# Patient Record
Sex: Female | Born: 1995 | Race: White | Hispanic: No | Marital: Single | Smoking: Former smoker
Health system: Southern US, Community
[De-identification: ages and names within clinical notes are randomized; demographics above are authoritative.]

## PROBLEM LIST (undated history)

## (undated) ENCOUNTER — Inpatient Hospital Stay: Payer: Self-pay

## (undated) DIAGNOSIS — F319 Bipolar disorder, unspecified: Secondary | ICD-10-CM

## (undated) DIAGNOSIS — F32A Depression, unspecified: Secondary | ICD-10-CM

## (undated) DIAGNOSIS — F329 Major depressive disorder, single episode, unspecified: Secondary | ICD-10-CM

## (undated) DIAGNOSIS — F988 Other specified behavioral and emotional disorders with onset usually occurring in childhood and adolescence: Secondary | ICD-10-CM

## (undated) DIAGNOSIS — F4310 Post-traumatic stress disorder, unspecified: Secondary | ICD-10-CM

## (undated) DIAGNOSIS — F419 Anxiety disorder, unspecified: Secondary | ICD-10-CM

## (undated) HISTORY — DX: Other specified behavioral and emotional disorders with onset usually occurring in childhood and adolescence: F98.8

## (undated) HISTORY — PX: EXTERNAL EAR SURGERY: SHX627

## (undated) HISTORY — PX: WISDOM TOOTH EXTRACTION: SHX21

---
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
## 2015-06-05 ENCOUNTER — Emergency Department
Admission: EM | Admit: 2015-06-05 | Discharge: 2015-06-05 | Disposition: A | Discharge status: Home / Self Care | Payer: BLUE CROSS/BLUE SHIELD | Attending: Emergency Medicine | Admitting: Emergency Medicine

## 2015-06-05 ENCOUNTER — Encounter: Payer: Self-pay | Admitting: Emergency Medicine

## 2015-06-05 ENCOUNTER — Emergency Department: Payer: BLUE CROSS/BLUE SHIELD

## 2015-06-05 DIAGNOSIS — F909 Attention-deficit hyperactivity disorder, unspecified type: Secondary | ICD-10-CM | POA: Insufficient documentation

## 2015-06-05 DIAGNOSIS — Z79899 Other long term (current) drug therapy: Secondary | ICD-10-CM | POA: Insufficient documentation

## 2015-06-05 DIAGNOSIS — F1290 Cannabis use, unspecified, uncomplicated: Secondary | ICD-10-CM | POA: Diagnosis not present

## 2015-06-05 DIAGNOSIS — Y998 Other external cause status: Secondary | ICD-10-CM | POA: Diagnosis not present

## 2015-06-05 DIAGNOSIS — Z87891 Personal history of nicotine dependence: Secondary | ICD-10-CM | POA: Insufficient documentation

## 2015-06-05 DIAGNOSIS — W2201XA Walked into wall, initial encounter: Secondary | ICD-10-CM | POA: Insufficient documentation

## 2015-06-05 DIAGNOSIS — S60221A Contusion of right hand, initial encounter: Secondary | ICD-10-CM | POA: Diagnosis not present

## 2015-06-05 DIAGNOSIS — Y929 Unspecified place or not applicable: Secondary | ICD-10-CM | POA: Insufficient documentation

## 2015-06-05 DIAGNOSIS — Y9389 Activity, other specified: Secondary | ICD-10-CM | POA: Diagnosis not present

## 2015-06-05 DIAGNOSIS — M79641 Pain in right hand: Secondary | ICD-10-CM | POA: Diagnosis present

## 2015-06-05 MED ORDER — MELOXICAM 15 MG PO TABS: 15.0000 mg | ORAL_TABLET | Freq: Every day | ORAL | Status: DC

## 2015-06-05 NOTE — ED Notes (Signed)
States she hit her right hand on a wall  Per pt she blacked and hit wall..bruising and swelling not to back of hand  Positive pulses and good circulation

## 2015-06-05 NOTE — ED Notes (Signed)
Punched a brick wall this morning.  C/o right hand pain.

## 2015-06-05 NOTE — ED Notes (Signed)
Patient discharged to home per MD order. Patient in stable condition, and deemed medically cleared by ED provider for discharge. Discharge instructions reviewed with patient/family using "Teach Back"; verbalized understanding of medication education and administration, and information about follow-up care. Denies further concerns. ° °

## 2015-06-05 NOTE — ED Provider Notes (Signed)
[facility] Emergency Department Provider Note  ____________________________________________  Time seen: Approximately 7:11 PM  I have reviewed the triage vital signs and the nursing notes.   HISTORY  Chief Complaint Hand Pain    HPI Melissa Khan is a 20 y.o. female who presents to emergency department complaining of right hand pain status post hitting a wall. Patient states that when she gets mad she becomes uncontrollable and she hit her hand against a brick wall with a closed fist. Patient is endorsing pain over the fourth knuckle. She denies any pain radiating up her arm. She denies any numbness tingling distal to injury. Patient is not taking any medications prior to arrival.   Past Medical History  Diagnosis Date  . ADD (attention deficit disorder)     There are no active problems to display for this patient.   History reviewed. No pertinent past surgical history.  Current Outpatient Rx  Name  Route  Sig  Dispense  Refill  . drospirenone-ethinyl estradiol (YAZ) 3-0.02 MG tablet   Oral   Take 1 tablet by mouth daily.   3 Package   4   . meloxicam (MOBIC) 15 MG tablet   Oral   Take 1 tablet (15 mg total) by mouth daily.   30 tablet   0   . metroNIDAZOLE (METROGEL VAGINAL) 0.75 % vaginal gel      1 applicator per vagina at HS x 5   70 g   0     Allergies Shrimp and Tramadol  Family History  Problem Relation Age of Onset  . Breast cancer Paternal Aunt     Social History Social History  Substance Use Topics  . Smoking status: Former Smoker    Quit date: 07/24/2014  . Smokeless tobacco: None  . Alcohol Use: No     Comment: ONCE A YEAR     Review of Systems  Constitutional: No fever/chills Cardiovascular: no chest pain. Respiratory: no cough. No SOB. Musculoskeletal: Positive for right hand pain. Positive for bruising. Skin: Negative for rash. Neurological: Negative for headaches, focal weakness or  numbness. 10-point ROS otherwise negative.  ____________________________________________   PHYSICAL EXAM:  VITAL SIGNS: ED Triage Vitals  Enc Vitals Group     BP 06/05/15 1826 99/45 mmHg     Pulse Rate 06/05/15 1826 91     Resp 06/05/15 1826 18     Temp 06/05/15 1826 97.8 F (36.6 C)     Temp Source 06/05/15 1826 Oral     SpO2 06/05/15 1826 100 %     Weight 06/05/15 1826 145 lb (65.772 kg)     Height 06/05/15 1826 5\' 3"  (1.6 m)     Head Cir --      Peak Flow --      Pain Score 06/05/15 1828 8     Pain Loc --      Pain Edu? --      Excl. in GC? --      Constitutional: Alert and oriented. Well appearing and in no acute distress. Eyes: Conjunctivae are normal. PERRL. EOMI. Cardiovascular: Normal rate, regular rhythm. Normal S1 and S2.  Good peripheral circulation. Respiratory: Normal respiratory effort without tachypnea or retractions. Lungs CTAB. Musculoskeletal: Edema and ecchymosis noted to the dorsal aspect of the right hand surrounding the third, fourth, fifth metacarpals. No deformity noted. Tenderness to palpation diffusely in the third, fourth, fifth metacarpals. No palpable abnormality. Full range of motion all digits. Sensation intact 5 digits. Capillary refill  intact 5 digits. Neurologic:  Normal speech and language. No gross focal neurologic deficits are appreciated.  Skin:  Skin is warm, dry and intact. No rash noted. Psychiatric: Mood and affect are normal. Speech and behavior are normal. Patient exhibits appropriate insight and judgement.   ____________________________________________   LABS (all labs ordered are listed, but only abnormal results are displayed)  Labs Reviewed - No data to display ____________________________________________  EKG   ____________________________________________  RADIOLOGY Festus Barren Cuthriell, personally viewed and evaluated these images (plain radiographs) as part of my medical decision making, as well as reviewing  the written report by the radiologist.  Dg Hand Complete Right  06/05/2015  CLINICAL DATA:  Right hand pain and swelling since punching a brick wall this morning. Initial encounter. EXAM: RIGHT HAND - COMPLETE 3+ VIEW COMPARISON:  None. FINDINGS: There is no evidence of fracture or dislocation. There is no evidence of arthropathy or other focal bone abnormality. Soft tissues are unremarkable. IMPRESSION: Negative exam. Electronically Signed   By: Drusilla Kanner M.D.   On: 06/05/2015 18:53    ____________________________________________    PROCEDURES  Procedure(s) performed:       Medications - No data to display   ____________________________________________   INITIAL IMPRESSION / ASSESSMENT AND PLAN / ED COURSE  Pertinent labs & imaging results that were available during my care of the patient were reviewed by me and considered in my medical decision making (see chart for details).  Patient's diagnosis is consistent with right hand contusion. X-ray reveals no acute fractures or other osseous abnormalities.. Patient will be discharged home with prescriptions for anti-inflammatories for symptom control. Her hand is wrapped with an Ace bandage here in the emergency department.. Patient is to follow up with primary care provider or orthopedics if symptoms persist past this treatment course. Patient is given ED precautions to return to the ED for any worsening or new symptoms.     ____________________________________________  FINAL CLINICAL IMPRESSION(S) / ED DIAGNOSES  Final diagnoses:  Hand contusion, right, initial encounter      NEW MEDICATIONS STARTED DURING THIS VISIT:  New Prescriptions   MELOXICAM (MOBIC) 15 MG TABLET    Take 1 tablet (15 mg total) by mouth daily.        This chart was dictated using voice recognition software/Dragon. Despite best efforts to proofread, errors can occur which can change the meaning. Any change was purely  unintentional.    Racheal Patches, PA-C 06/05/15 1918  Jene Every, MD 06/05/15 2243

## 2015-06-05 NOTE — Discharge Instructions (Signed)
Hand Contusion  A hand contusion is a deep bruise on your hand area. Contusions are the result of an injury that caused bleeding under the skin. The contusion may turn blue, purple, or yellow. Minor injuries will give you a painless contusion, but more severe contusions may stay painful and swollen for a few weeks.  CAUSES   A contusion is usually caused by a blow, trauma, or direct force to an area of the body.  SYMPTOMS    Swelling and redness of the injured area.   Discoloration of the injured area.   Tenderness and soreness of the injured area.   Pain.  DIAGNOSIS   The diagnosis can be made by taking a history and performing a physical exam. An X-ray, CT scan, or MRI may be needed to determine if there were any associated injuries, such as broken bones (fractures).  TREATMENT   Often, the best treatment for a hand contusion is resting, elevating, icing, and applying cold compresses to the injured area. Over-the-counter medicines may also be recommended for pain control.  HOME CARE INSTRUCTIONS    Put ice on the injured area.    Put ice in a plastic bag.    Place a towel between your skin and the bag.    Leave the ice on for 15-20 minutes, 03-04 times a day.   Only take over-the-counter or prescription medicines as directed by your caregiver. Your caregiver may recommend avoiding anti-inflammatory medicines (aspirin, ibuprofen, and naproxen) for 48 hours because these medicines may increase bruising.   If told, use an elastic wrap as directed. This can help reduce swelling. You may remove the wrap for sleeping, showering, and bathing. If your fingers become numb, cold, or blue, take the wrap off and reapply it more loosely.   Elevate your hand with pillows to reduce swelling.   Avoid overusing your hand if it is painful.  SEEK IMMEDIATE MEDICAL CARE IF:    You have increased redness, swelling, or pain in your hand.   Your swelling or pain is not relieved with medicines.   You have loss of feeling in  your hand or are unable to move your fingers.   Your hand turns cold or blue.   You have pain when you move your fingers.   Your hand becomes warm to the touch.   Your contusion does not improve in 2 days.  MAKE SURE YOU:    Understand these instructions.   Will watch your condition.   Will get help right away if you are not doing well or get worse.     This information is not intended to replace advice given to you by your health care provider. Make sure you discuss any questions you have with your health care provider.     Document Released: 08/14/2001 Document Revised: 11/17/2011 Document Reviewed: 08/16/2011  Elsevier Interactive Patient Education 2016 [employer]

## 2015-09-07 ENCOUNTER — Emergency Department
Admission: EM | Admit: 2015-09-07 | Discharge: 2015-09-08 | Disposition: A | Discharge status: Other Acute Inpatient Hospital | Payer: BLUE CROSS/BLUE SHIELD | Attending: Emergency Medicine | Admitting: Emergency Medicine

## 2015-09-07 DIAGNOSIS — R440 Auditory hallucinations: Secondary | ICD-10-CM | POA: Diagnosis present

## 2015-09-07 DIAGNOSIS — Z87891 Personal history of nicotine dependence: Secondary | ICD-10-CM | POA: Insufficient documentation

## 2015-09-07 DIAGNOSIS — F329 Major depressive disorder, single episode, unspecified: Secondary | ICD-10-CM | POA: Diagnosis not present

## 2015-09-07 DIAGNOSIS — F29 Unspecified psychosis not due to a substance or known physiological condition: Secondary | ICD-10-CM

## 2015-09-07 DIAGNOSIS — Z79899 Other long term (current) drug therapy: Secondary | ICD-10-CM | POA: Insufficient documentation

## 2015-09-07 DIAGNOSIS — F988 Other specified behavioral and emotional disorders with onset usually occurring in childhood and adolescence: Secondary | ICD-10-CM | POA: Insufficient documentation

## 2015-09-07 LAB — CBC
HCT: 38.7 % (ref 35.0–47.0)
Hemoglobin: 13 g/dL (ref 12.0–16.0)
MCH: 27.8 pg (ref 26.0–34.0)
MCHC: 33.6 g/dL (ref 32.0–36.0)
MCV: 82.8 fL (ref 80.0–100.0)
PLATELETS: 276 10*3/uL (ref 150–440)
RBC: 4.68 MIL/uL (ref 3.80–5.20)
RDW: 16.2 % — AB (ref 11.5–14.5)
WBC: 7.2 10*3/uL (ref 3.6–11.0)

## 2015-09-07 LAB — COMPREHENSIVE METABOLIC PANEL
ALT: 21 U/L (ref 14–54)
AST: 24 U/L (ref 15–41)
Albumin: 4.4 g/dL (ref 3.5–5.0)
Alkaline Phosphatase: 52 U/L (ref 38–126)
Anion gap: 7 (ref 5–15)
BILIRUBIN TOTAL: 1.1 mg/dL (ref 0.3–1.2)
BUN: 13 mg/dL (ref 6–20)
CHLORIDE: 104 mmol/L (ref 101–111)
CO2: 26 mmol/L (ref 22–32)
CREATININE: 0.8 mg/dL (ref 0.44–1.00)
Calcium: 9.2 mg/dL (ref 8.9–10.3)
Glucose, Bld: 134 mg/dL — ABNORMAL HIGH (ref 65–99)
POTASSIUM: 3 mmol/L — AB (ref 3.5–5.1)
Sodium: 137 mmol/L (ref 135–145)
TOTAL PROTEIN: 7.5 g/dL (ref 6.5–8.1)

## 2015-09-07 LAB — URINE DRUG SCREEN, QUALITATIVE (ARMC ONLY)
Amphetamines, Ur Screen: NOT DETECTED
BARBITURATES, UR SCREEN: NOT DETECTED
Benzodiazepine, Ur Scrn: POSITIVE — AB
CANNABINOID 50 NG, UR ~~LOC~~: POSITIVE — AB
Cocaine Metabolite,Ur ~~LOC~~: NOT DETECTED
MDMA (Ecstasy)Ur Screen: NOT DETECTED
Methadone Scn, Ur: NOT DETECTED
Opiate, Ur Screen: NOT DETECTED
PHENCYCLIDINE (PCP) UR S: NOT DETECTED
TRICYCLIC, UR SCREEN: NOT DETECTED

## 2015-09-07 LAB — ETHANOL

## 2015-09-07 LAB — ACETAMINOPHEN LEVEL: Acetaminophen (Tylenol), Serum: <10 ug/mL — ABNORMAL LOW (ref 10–30)

## 2015-09-07 LAB — SALICYLATE LEVEL

## 2015-09-07 NOTE — ED Notes (Signed)

## 2015-09-07 NOTE — ED Notes (Signed)
BEHAVIORAL HEALTH ROUNDING  Patient sleeping: No.  Patient alert and oriented: yes  Behavior appropriate: Yes. ; If no, describe:  Nutrition and fluids offered: Yes  Toileting and hygiene offered: Yes  Sitter present: not applicable, Q 15 min safety rounds and observation.  Law enforcement present: Yes ODS  
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
## 2015-09-07 NOTE — ED Notes (Addendum)
POC Urine Pregnancy Done; Negative Result.
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
## 2015-09-07 NOTE — ED Notes (Signed)
[facility] computer removed from room. Sitter at bedside. Will call reports to ED BHU RN.

## 2015-09-08 ENCOUNTER — Inpatient Hospital Stay
Admission: AD | Admit: 2015-09-08 | Discharge: 2015-09-09 | DRG: 885 | Disposition: A | Discharge status: Home / Self Care | Payer: BLUE CROSS/BLUE SHIELD | Source: Intra-hospital | Attending: Psychiatry | Admitting: Psychiatry

## 2015-09-08 ENCOUNTER — Encounter: Payer: Self-pay | Admitting: Psychiatry

## 2015-09-08 DIAGNOSIS — F1310 Sedative, hypnotic or anxiolytic abuse, uncomplicated: Secondary | ICD-10-CM | POA: Diagnosis present

## 2015-09-08 DIAGNOSIS — N390 Urinary tract infection, site not specified: Secondary | ICD-10-CM | POA: Diagnosis present

## 2015-09-08 DIAGNOSIS — Z803 Family history of malignant neoplasm of breast: Secondary | ICD-10-CM | POA: Diagnosis not present

## 2015-09-08 DIAGNOSIS — Z87891 Personal history of nicotine dependence: Secondary | ICD-10-CM | POA: Diagnosis not present

## 2015-09-08 DIAGNOSIS — Z818 Family history of other mental and behavioral disorders: Secondary | ICD-10-CM

## 2015-09-08 DIAGNOSIS — S61519A Laceration without foreign body of unspecified wrist, initial encounter: Secondary | ICD-10-CM | POA: Diagnosis present

## 2015-09-08 DIAGNOSIS — F333 Major depressive disorder, recurrent, severe with psychotic symptoms: Secondary | ICD-10-CM

## 2015-09-08 DIAGNOSIS — X789XXA Intentional self-harm by unspecified sharp object, initial encounter: Secondary | ICD-10-CM | POA: Diagnosis present

## 2015-09-08 DIAGNOSIS — G4700 Insomnia, unspecified: Secondary | ICD-10-CM | POA: Diagnosis present

## 2015-09-08 DIAGNOSIS — F323 Major depressive disorder, single episode, severe with psychotic features: Secondary | ICD-10-CM | POA: Diagnosis present

## 2015-09-08 DIAGNOSIS — R45851 Suicidal ideations: Secondary | ICD-10-CM | POA: Diagnosis present

## 2015-09-08 DIAGNOSIS — F1220 Cannabis dependence, uncomplicated: Secondary | ICD-10-CM | POA: Diagnosis present

## 2015-09-08 DIAGNOSIS — Z888 Allergy status to other drugs, medicaments and biological substances status: Secondary | ICD-10-CM

## 2015-09-08 DIAGNOSIS — F332 Major depressive disorder, recurrent severe without psychotic features: Principal | ICD-10-CM | POA: Diagnosis present

## 2015-09-08 LAB — URINALYSIS COMPLETE WITH MICROSCOPIC (ARMC ONLY)
BACTERIA UA: NONE SEEN
Bilirubin Urine: NEGATIVE
GLUCOSE, UA: NEGATIVE mg/dL
Leukocytes, UA: NEGATIVE
NITRITE: NEGATIVE
Protein, ur: 100 mg/dL — AB
SPECIFIC GRAVITY, URINE: 1.03 (ref 1.005–1.030)
pH: 6 (ref 5.0–8.0)

## 2015-09-08 MED ORDER — TRAZODONE HCL 100 MG PO TABS: 100.0000 mg | ORAL_TABLET | Freq: Every day | ORAL | Status: DC

## 2015-09-08 MED ORDER — TRAZODONE HCL 100 MG PO TABS: 100.0000 mg | ORAL_TABLET | Freq: Every evening | ORAL | Status: DC | PRN

## 2015-09-08 MED ORDER — MAGNESIUM HYDROXIDE 400 MG/5ML PO SUSP: 30.0000 mL | Freq: Every day | ORAL | Status: DC | PRN

## 2015-09-08 MED ORDER — LORAZEPAM 2 MG PO TABS
2.0000 mg | ORAL_TABLET | ORAL | Status: DC | PRN
  Administered 2015-09-08: 2 mg via ORAL
  Filled 2015-09-08: qty 1

## 2015-09-08 MED ORDER — PRAZOSIN HCL 2 MG PO CAPS
2.0000 mg | ORAL_CAPSULE | Freq: Two times a day (BID) | ORAL | Status: DC
  Administered 2015-09-08: 2 mg via ORAL
  Filled 2015-09-08: qty 1

## 2015-09-08 MED ORDER — ALUM & MAG HYDROXIDE-SIMETH 200-200-20 MG/5ML PO SUSP: 30.0000 mL | ORAL | Status: DC | PRN

## 2015-09-08 MED ORDER — IBUPROFEN 600 MG PO TABS: 600.0000 mg | ORAL_TABLET | Freq: Four times a day (QID) | ORAL | Status: DC | PRN

## 2015-09-08 MED ORDER — RISPERIDONE 1 MG PO TABS: 2.0000 mg | ORAL_TABLET | Freq: Every day | ORAL | Status: DC

## 2015-09-08 MED ORDER — LAMOTRIGINE 25 MG PO TABS
25.0000 mg | ORAL_TABLET | Freq: Every day | ORAL | Status: DC
  Administered 2015-09-08: 25 mg via ORAL
  Filled 2015-09-08: qty 1

## 2015-09-08 MED ORDER — ARIPIPRAZOLE 2 MG PO TABS
2.0000 mg | ORAL_TABLET | Freq: Every day | ORAL | Status: DC
  Filled 2015-09-08: qty 1

## 2015-09-08 MED ORDER — OXCARBAZEPINE 150 MG PO TABS: 150.0000 mg | ORAL_TABLET | Freq: Two times a day (BID) | ORAL | Status: DC

## 2015-09-08 NOTE — ED Notes (Signed)
Report was received from Dorise HissElizabeth C., RN; PtTrenton Khan. Verbalizes; "I don't need to be here; I will never get fixed; the pain is too deep."; verbalizes having S.I; with self inflicted superficial cuts to left arm(upper and lower); denies having h.i.; Continue to monitor with 15 min. Monitoring.

## 2015-09-08 NOTE — ED Notes (Signed)
Patient given meal tray which she says she will not eat.

## 2015-09-08 NOTE — ED Notes (Signed)
[facility] inpatient treatment. Cooperative with transfer. All personal belongings sent with patient.

## 2015-09-08 NOTE — ED Notes (Signed)

## 2015-09-08 NOTE — ED Notes (Signed)
Floor not able to take report/patient until after 12 noon.

## 2015-09-08 NOTE — ED Notes (Signed)
Patient asleep in room. No noted distress or abnormal behavior. Will continue 15 minute checks and observation by security cameras for safety. 

## 2015-09-08 NOTE — ED Notes (Signed)
Report given to floor. They will be able to accept patient at 2 pm.

## 2015-09-08 NOTE — ED Notes (Signed)
Patient upset that she will be admitted into inpatient unit. States she cannot be at the hospital where her some of her family members died. She is minimizing some of the behaviors which resulted in commitment for treatment. Maintained on 15 minute checks and observation by security camera for safety.

## 2015-09-08 NOTE — ED Notes (Signed)
Patient's mother in to visit 

## 2015-09-08 NOTE — [facility] Group Notes (Signed)
[facility] Group Notes:  (Nursing/MHT/Case Management/Adjunct)  Date:  09/08/2015  Time:  4:18 PM  Type of Therapy:  Psychoeducational Skills  Participation Level:  Did Not Attend    Hani Patnode M Moses Ellison 09/08/2015, 4:18 PM 

## 2015-09-08 NOTE — Tx Team (Signed)
Initial Interdisciplinary Treatment Plan   PATIENT STRESSORS: Medication change or noncompliance Occupational concerns Substance abuse   PATIENT STRENGTHS: Average or above average intelligence Communication skills Supportive family/friends   PROBLEM LIST: Problem List/Patient Goals Date to be addressed Date deferred Reason deferred Estimated date of resolution  Major depression 09/08/2015     Substance abuse 09/08/2015                                                DISCHARGE CRITERIA:  Ability to meet basic life and health needs Adequate post-discharge living arrangements Verbal commitment to aftercare and medication compliance  PRELIMINARY DISCHARGE PLAN: Attend aftercare/continuing care group Return to previous living arrangement  PATIENT/FAMIILY INVOLVEMENT: This treatment plan has been presented to and reviewed with the patient, Ronni RumbleArizona E Coole, and/or family member,   The patient and family have been given the opportunity to ask questions and make suggestions.  Margo CommonGigi George Adrina Armijo 09/08/2015, 3:52 PM

## 2015-09-08 NOTE — Progress Notes (Deleted)
Per Dr. Lucianne MussKumar, [facility] Admission and bed assignment pending. Admitting Dr. Lucianne MussKumar. Demetre Monaco K. Sherlon HandingHarris, LCAS-A, LPC-A, [facility]  Counselor 09/08/2015 9:12 AM

## 2015-09-08 NOTE — Progress Notes (Signed)
Patient sad but cooperative during admission assessment. Patient denies SI/HI at this time. Patient denies AVH. Patient informed of fall risk status, fall risk assessed "low" at this time. Patient oriented to unit/staff/room. Patient denies any questions/concerns at this time. Patient safe on unit with Q15 minute checks for safety. Skin assessment & body search done.No contraband found.

## 2015-09-08 NOTE — Progress Notes (Signed)
[facility] by Dr. Dr. Lucianne MussKumar.  Attending Physician will be Dr. Ardyth HarpsHernandez and Pucilowska.   Patient has been assigned to room 322, [facility] Marylu LundJanet.   Intake Paper Work has been signed and placed on patient chart.  ER staff is aware of the admission ( Lisa,ER Sect.; Dr. Mayford KnifeWilliams , ER MD; Amy,Patient's Nurse & Robley FriesUrcella Patient Access).  Kem Hensen K. Alexsandra Shontz, LCAS-A, LPC-A, [facility]  Counselor 09/08/2015 10:50 AM

## 2015-09-08 NOTE — ED Provider Notes (Signed)
-----------------------------------------   3:43 AM on 09/08/2015 -----------------------------------------   Blood pressure 119/66, pulse 108, temperature 98.6 F (37 C), temperature source Oral, resp. rate 18, height 5\' 3"  (1.6 m), weight 140 lb (63.504 kg), last menstrual period 09/07/2015, SpO2 99 %.  The patient had no acute events since last update.  Calm and cooperative at this time.  Disposition is pending per Psychiatry/Behavioral Medicine team recommendations.     Arnaldo NatalPaul F Zaidan Keeble, MD 09/08/15 [phone]

## 2015-09-08 NOTE — ED Notes (Signed)
Patient resting quietly in room. No noted distress or abnormal behaviors noted. Will continue 15 minute checks and observation by security camera for safety. 

## 2015-09-08 NOTE — ED Notes (Signed)
Patient spoke with her mother on the phone.  Patient is insisting that she does not need to be in the hospital and if kept, she will become irritable and angry. Behavioral expectations explained to her.  Patient is malodorous and disheveled but is refusing to take a shower. She states she will not use hospital hygiene products. She is also refusing to eat hospital food because it "reminds her of the time her grandmother was in the hospital."  Patient noted to have multiple superficial lacerations on left inner forearm and upper left shoulder. No s/s infection.Maintained on 15 minute checks and observation by security camera for safety.
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
## 2015-09-08 NOTE — ED Notes (Signed)
ED BHU PLACEMENT JUSTIFICATION Is the patient under IVC or is there intent for IVC: Yes.   Is the patient medically cleared: Yes.   Is there vacancy in the ED BHU: Yes.   Is the population mix appropriate for patient: Yes.   Is the patient awaiting placement in inpatient or outpatient setting: Yes.   Has the patient had a psychiatric consult: Yes.   Survey of unit performed for contraband, proper placement and condition of furniture, tampering with fixtures in bathroom, shower, and each patient room: Yes.   APPEARANCE/BEHAVIOR cooperative and adequate rapport can be established NEURO ASSESSMENT Orientation: place and person Hallucinations: No.None noted (Hallucinations) Speech: Normal Gait: normal RESPIRATORY ASSESSMENT Normal expansion.  Clear to auscultation.  No rales, rhonchi, or wheezing. CARDIOVASCULAR ASSESSMENT regular rate and rhythm, S1, S2 normal, no murmur, click, rub or gallop GASTROINTESTINAL ASSESSMENT soft, nontender, BS WNL, no r/g EXTREMITIES normal strength, tone, and muscle mass PLAN OF CARE Provide calm/safe environment. Vital signs assessed twice daily. ED BHU Assessment once each 12-hour shift. Collaborate with intake RN daily or as condition indicates. Assure the ED provider has rounded once each shift. Provide and encourage hygiene. Provide redirection as needed. Assess for escalating behavior; address immediately and inform ED provider.  Assess family dynamic and appropriateness for visitation as needed: Yes.   Educate the patient/family about BHU procedures/visitation: Yes.   

## 2015-09-08 NOTE — ED Notes (Signed)
Report to Margaret, RN in ED BHU.  

## 2015-09-08 NOTE — [facility] Suicide Risk Assessment (Signed)
[facility]   Nursing information obtained from:    Demographic factors:    Current Mental Status:    Loss Factors:    Historical Factors:    Risk Reduction Factors:     Total Time spent with patient: 1 hour Principal Problem: Severe recurrent major depressive disorder with psychotic features [place]([facility]) Diagnosis:   Patient Active Problem List   Diagnosis Date Noted  . Severe recurrent major depressive disorder with psychotic features ([facility]) [F33.3] 09/08/2015  . Sedative, hypnotic or anxiolytic use disorder, mild, abuse [F13.10] 09/08/2015  . Cannabis use disorder, moderate, dependence ([facility]) [F12.20] 09/08/2015  . Suicidal ideation [R45.851] 09/08/2015  . Self-inflicted laceration of wrist [S61.519A] 09/08/2015   Subjective Data: Depression, mood instability, self-injurious behaviors, substance use.  Continued Clinical Symptoms:  Alcohol Use Disorder Identification Test Final Score (AUDIT): 1 The "Alcohol Use Disorders Identification Test", Guidelines for Use in Primary Care, Second Edition.  World Science writerHealth Organization [facility](WHO). Score between 0-7:  no or low risk or alcohol related problems. Score between 8-15:  moderate risk of alcohol related problems. Score between 16-19:  high risk of alcohol related problems. Score 20 or above:  warrants further diagnostic evaluation for alcohol dependence and treatment.   CLINICAL FACTORS:   Severe Anxiety and/or Agitation Depression:   Impulsivity Alcohol/Substance Abuse/Dependencies   Musculoskeletal: Strength & Muscle Tone: within normal limits Gait & Station: normal Patient leans: N/A  Psychiatric Specialty Exam: Physical Exam  Nursing note and vitals reviewed.   Review of Systems  Psychiatric/Behavioral: Positive for depression and substance abuse. The patient is nervous/anxious.   All other systems reviewed and are negative.   Blood pressure 118/72, pulse 88, temperature 98.2 F (36.8 C), temperature  source Oral, resp. rate 18, height 5\' 3"  (1.6 m), weight 60.782 kg (134 lb), last menstrual period 09/07/2015, SpO2 100 %.Body mass index is 23.74 kg/(m^2).  General Appearance: Fairly Groomed  Eye Contact:  Good  Speech:  Clear and Coherent  Volume:  Normal  Mood:  Anxious  Affect:  Appropriate  Thought Process:  Goal Directed  Orientation:  Full (Time, Place, and Person)  Thought Content:  Hallucinations: Auditory Command:  Telling her to hurt herself  Suicidal Thoughts:  Yes.  with intent/plan  Homicidal Thoughts:  No  Memory:  Immediate;   Fair Recent;   Fair Remote;   Fair  Judgement:  Impaired  Insight:  Lacking  Psychomotor Activity:  Normal  Concentration:  Concentration: Fair and Attention Span: Fair  Recall:  [employer]is[employer]Fair  Fund of Knowledge:  Fair  Language:  Fair  Akathisia:  No  Handed:  Right  AIMS (if indicated):     Assets:  Communication Skills Desire for Improvement Financial Resources/Insurance Housing Physical Health Resilience Social Support  ADL's:  Intact  Cognition:  WNL  Sleep:         COGNITIVE FEATURES THAT CONTRIBUTE TO RISK:  None    SUICIDE RISK:   Moderate:  Frequent suicidal ideation with limited intensity, and duration, some specificity in terms of plans, no associated intent, good self-control, limited dysphoria/symptomatology, some risk factors present, and identifiable protective factors, including available and accessible social support.  PLAN OF CARE: Hospital admission, medication management, substance abuse counseling, discharge planning.  [name] is a 20 year old female with history of depression, anxiety, psychosis, mood instability and self-injurious behavior admitted for worsening of her symptoms and cutting episode.  1. Suicidal ideation. The patient is able to contract for safety.  2. Mood and psychosis.  We will start Trileptal for mood stabilization and Risperdal for psychotic symptoms.  3. Substance abuse. The patient was  positive for cannabis and benzodiazepines. She minimizes her problems and declines treatment.  4. Smoking. Nicotine patch is available.  5. Insomnia. Trazodone is available.  6. Disposition. She will be discharged to home with her mother. She will follow up with [facility].   I certify that inpatient services furnished can reasonably be expected to improve the patient's condition.   Kristine LineaJolanta Hamid Brookens, MD 09/08/2015, 3:34 PM

## 2015-09-09 DIAGNOSIS — F333 Major depressive disorder, recurrent, severe with psychotic symptoms: Secondary | ICD-10-CM | POA: Insufficient documentation

## 2015-09-09 LAB — LIPID PANEL
CHOL/HDL RATIO: 5.6 ratio
CHOLESTEROL: 189 mg/dL (ref 0–200)
HDL: 34 mg/dL — AB (ref >40)
LDL Cholesterol: 134 mg/dL — ABNORMAL HIGH (ref 0–99)
TRIGLYCERIDES: 103 mg/dL (ref <150)
VLDL: 21 mg/dL (ref 0–40)

## 2015-09-09 LAB — TSH: TSH: 2.313 u[IU]/mL (ref 0.350–4.500)

## 2015-09-09 LAB — HEMOGLOBIN A1C: Hgb A1c MFr Bld: 5.4 % (ref 4.0–6.0)

## 2015-09-09 MED ORDER — FOSFOMYCIN TROMETHAMINE 3 G PO PACK
3.0000 g | PACK | Freq: Once | ORAL | Status: DC
  Filled 2015-09-09: qty 3

## 2015-09-09 MED ORDER — LAMOTRIGINE 25 MG PO TABS: 25.0000 mg | ORAL_TABLET | Freq: Every day | ORAL | Status: DC

## 2015-09-09 MED ORDER — PRAZOSIN HCL 2 MG PO CAPS: 2.0000 mg | ORAL_CAPSULE | Freq: Two times a day (BID) | ORAL | Status: DC

## 2015-09-09 MED ORDER — TRAZODONE HCL 100 MG PO TABS: 100.0000 mg | ORAL_TABLET | Freq: Every evening | ORAL | Status: DC | PRN

## 2015-09-09 NOTE — [facility] Group Notes (Signed)
[facility] LCSW Group Therapy   09/09/2015 9:30 am  Type of Therapy: Group Therapy   Participation Level: Invited but did not attend.  Participation Quality: Invited but did not attend.    Melissa Khan R. Sira Adsit, LCSWA   
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
## 2015-09-09 NOTE — [facility] Group Notes (Signed)
[facility] Group Notes:  (Nursing/MHT/Case Management/Adjunct)  Date:  09/09/2015  Time:  3:55 AM  Type of Therapy:  Psychoeducational Skills  Participation Level:  Minimal  Participation Quality:  Appropriate  Affect:  Appropriate  Cognitive:  Appropriate  Insight:  Appropriate and Lacking  Engagement in Group:  Limited  Modes of Intervention:  Discussion, Socialization and Support  Summary of Progress/Problems:  Chancy MilroyLaquanda Y Khamryn Calderone 09/09/2015, 3:55 AM

## 2015-09-09 NOTE — [facility] Suicide Risk Assessment (Signed)
[facility] INPATIENT:  Family/Significant Other Suicide Prevention Education  Suicide Prevention Education:  Education Completed; Fish farm managerDenice Zaucha, Mother,  (name of family member/significant other) has been identified by the patient as the family member/significant other with whom the patient will be residing, and identified as the person(s) who will aid the patient in the event of a mental health crisis (suicidal ideations/suicide attempt).  With written consent from the patient, the family member/significant other has been provided the following suicide prevention education, prior to the and/or following the discharge of the patient.  The suicide prevention education provided includes the following:  Suicide risk factors  Suicide prevention and interventions  National Suicide Hotline telephone number  [facility] assessment telephone number  [facility] Emergency Assistance 911  [place] and/or Residential Mobile Crisis Unit telephone number  Request made of family/significant other to:  Remove weapons (e.g., guns, rifles, knives), all items previously/currently identified as safety concern.    Remove drugs/medications (over-the-counter, prescriptions, illicit drugs), all items previously/currently identified as a safety concern.  The family member/significant other verbalizes understanding of the suicide prevention education information provided.  The family member/significant other agrees to remove the items of safety concern listed above.  Glennon MacLaws, Alfredo Spong P, MSW, LCSW 09/09/2015, 2:43 PM

## 2015-09-09 NOTE — Plan of Care (Signed)
Problem: Coping: Goal: Ability to verbalize frustrations and anger appropriately will improve Outcome: Progressing Verbalizing frustration.

## 2015-09-09 NOTE — Progress Notes (Signed)
Patient discharged home. DC instructions provided and explained, medications reviewed. Rx given. All questions answered. Belongings returned from safe and locker. Stored medications returned. Denies SI, HI, AVH.
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
## 2015-09-09 NOTE — Progress Notes (Addendum)
D: Pt denies SI/HI/AVH. Pt is irritable about her admission on the unit, but is cooperative with treatment plan, Patient  affect is blunted. Pt appears anxious and restless, she stated she is "worried that she is not with her service animal that calms her down and request for discharge now" MD on call was notified of patient restless, anxiety ad sometimes tearful episodes. Patient was counseled, supported and reassured     A: Pt was offered support and encouragement. Pt was also given scheduled medications. Pt was encouraged to attend groups. Q 15 minute checks were done for safety.  R:Pt attends groups and interacts well with peers and staff. Pt is taking medication. Pt has no complaints.Pt receptive to treatment and safety maintained on unit.

## 2015-09-09 NOTE — Progress Notes (Signed)
  [facility] :  Will you be returning to the same living situation after discharge:  Yes,    At discharge, do you have transportation home?: Yes,  '  Do you have the ability to pay for your medications: Yes,     Release of information consent forms completed and in the chart;  Patient's signature needed at discharge.  Patient to Follow up at: Follow-up Information    Go to [facility].   Why:  Referral made for Hospital Follow up, Outpatient Medication Management, Therapy.  Please call to schedule appointment [facility] after referral has been reviewed.   Contact information:   [address] [place] [id number] [phone] Fax      Next level of care provider has access to [facility] Link:yes  Safety Planning and Suicide Prevention discussed: Yes,     Have you used any form of tobacco in the last 30 days? (Cigarettes, Smokeless Tobacco, Cigars, and/or Pipes): No  Has patient been referred to the Quitline?: N/A patient is not a smoker  Patient has been referred for addiction treatment: Yes  Joslyne Marshburn, Cleda DaubSara P, MSW, LCSW 09/09/2015, 2:43 PM

## 2015-09-09 NOTE — [facility] Suicide Risk Assessment (Signed)
[facility]   Principal Problem: Severe episode of recurrent major depressive disorder, without psychotic features [facility](HCC) Discharge Diagnoses:  Patient Active Problem List   Diagnosis Date Noted  . Severe episode of recurrent major depressive disorder, without psychotic features (HCC) [F33.2] 09/08/2015  . Sedative, hypnotic or anxiolytic use disorder, mild, abuse [F13.10] 09/08/2015  . Cannabis use disorder, moderate, dependence (HCC) [F12.20] 09/08/2015  . Suicidal ideation [R45.851] 09/08/2015  . Self-inflicted laceration of wrist [S61.519A] 09/08/2015    Total Time spent with patient: 30 minutes  Musculoskeletal: Strength & Muscle Tone: within normal limits Gait & Station: normal Patient leans: N/A  Psychiatric Specialty Exam: Review of Systems  Psychiatric/Behavioral: Positive for substance abuse. The patient is nervous/anxious.   All other systems reviewed and are negative.   Blood pressure 120/76, pulse 88, temperature 98.2 F (36.8 C), temperature source Oral, resp. rate 18, height 5\' 3"  (1.6 m), weight 60.782 kg (134 lb), last menstrual period 09/07/2015, SpO2 100 %.Body mass index is 23.74 kg/(m^2).  General Appearance: Casual  Eye Contact::  Good  Speech:  Clear and Coherent409  Volume:  Normal  Mood:  Euthymic  Affect:  Appropriate  Thought Process:  Goal Directed  Orientation:  Full (Time, Place, and Person)  Thought Content:  WDL  Suicidal Thoughts:  No  Homicidal Thoughts:  No  Memory:  Immediate;   Fair Recent;   Fair Remote;   Fair  Judgement:  Impaired  Insight:  Shallow  Psychomotor Activity:  Normal  Concentration:  Fair  Recall:  FiservFair  Fund of Knowledge:Fair  Language: Fair  Akathisia:  No  Handed:  Right  AIMS (if indicated):     Assets:  Communication Skills Desire for Improvement Financial Resources/Insurance Housing Physical Health Resilience Social Support Transportation Vocational/Educational  Sleep:      Cognition: WNL  ADL's:  Intact   Mental Status Per Nursing Assessment::   On Admission:  Self-harm behaviors  Demographic Factors:  Adolescent or young adult, Caucasian, Low socioeconomic status and Unemployed  Loss Factors: Loss of significant relationship and Financial problems/change in socioeconomic status  Historical Factors: Family history of mental illness or substance abuse and Impulsivity  Risk Reduction Factors:   Sense of responsibility to family, Living with another person, especially a relative and Positive social support  Continued Clinical Symptoms:  Depression:   Comorbid alcohol abuse/dependence Impulsivity Alcohol/Substance Abuse/Dependencies  Cognitive Features That Contribute To Risk:  None    Suicide Risk:  Minimal: No identifiable suicidal ideation.  Patients presenting with no risk factors but with morbid ruminations; may be classified as minimal risk based on the severity of the depressive symptoms    Plan Of Care/Follow-up recommendations:  Activity:  as tolerated. Diet:  regular. Other:  keep follow up appointments.  Kristine LineaJolanta Marvion Bastidas, MD 09/09/2015, 6:03 AM

## 2015-09-10 LAB — PROLACTIN: Prolactin: 77 ng/mL — ABNORMAL HIGH (ref 4.8–23.3)

## 2015-11-07 ENCOUNTER — Other Ambulatory Visit: Payer: Self-pay | Admitting: Women's Health

## 2015-11-07 DIAGNOSIS — Z30011 Encounter for initial prescription of contraceptive pills: Secondary | ICD-10-CM

## 2016-02-11 ENCOUNTER — Encounter: Payer: BLUE CROSS/BLUE SHIELD | Admitting: Women's Health

## 2016-03-08 NOTE — L&D Delivery Note (Signed)
EFM reapplied after pt shower completed.

## 2016-03-16 ENCOUNTER — Encounter: Payer: Self-pay | Admitting: Women's Health

## 2016-03-16 ENCOUNTER — Other Ambulatory Visit: Payer: Self-pay | Admitting: Women's Health

## 2016-03-16 ENCOUNTER — Ambulatory Visit (INDEPENDENT_AMBULATORY_CARE_PROVIDER_SITE_OTHER): Payer: 59

## 2016-03-16 ENCOUNTER — Ambulatory Visit (INDEPENDENT_AMBULATORY_CARE_PROVIDER_SITE_OTHER): Payer: 59 | Admitting: Women's Health

## 2016-03-16 VITALS — BP 122/78 | Ht 63.0 in | Wt 159.0 lb

## 2016-03-16 DIAGNOSIS — O200 Threatened abortion: Secondary | ICD-10-CM

## 2016-03-16 DIAGNOSIS — N912 Amenorrhea, unspecified: Secondary | ICD-10-CM | POA: Diagnosis not present

## 2016-03-16 DIAGNOSIS — O3680X0 Pregnancy with inconclusive fetal viability, not applicable or unspecified: Secondary | ICD-10-CM | POA: Diagnosis not present

## 2016-03-16 DIAGNOSIS — Z113 Encounter for screening for infections with a predominantly sexual mode of transmission: Secondary | ICD-10-CM

## 2016-03-16 LAB — PREGNANCY, URINE: Preg Test, Ur: POSITIVE — AB
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
## 2016-03-16 NOTE — Patient Instructions (Addendum)
Major Depressive Disorder, Adult Major depressive disorder (MDD) is a mental health condition. MDD often makes you feel sad, hopeless, or helpless. MDD can also cause symptoms in your body. MDD can affect your:  Work.  School.  Relationships.  Other normal activities. MDD can range from mild to very bad. It may occur once (single episode MDD). It can also occur many times (recurrent MDD). The main symptoms of MDD often include:  Feeling sad, depressed, or irritable most of the time.  Loss of interest. MDD symptoms also include:  Sleeping too much or too little.  Eating too much or too little.  A change in your weight.  Feeling tired (fatigue) or having low energy.  Feeling worthless.  Feeling guilty.  Trouble making decisions.  Trouble thinking clearly.  Thoughts of suicide or harming others.  Feeling weak.  Feeling agitated.  Keeping yourself from being around other people (isolation). Follow these instructions at home: Activity  Do these things as told by your doctor:  Go back to your normal activities.  Exercise regularly.  Spend time outdoors. Alcohol  Talk with your doctor about how alcohol can affect your antidepressant medicines.  Do not drink alcohol. Or, limit how much alcohol you drink.  This means no more than 1 drink a day for nonpregnant women and 2 drinks a day for men. One drink equals one of these:  12 oz of beer.  5 oz of wine.  1 oz of hard liquor. General instructions  Take over-the-counter and prescription medicines only as told by your doctor.  Eat a healthy diet.  Get plenty of sleep.  Find activities that you enjoy. Make time to do them.  Think about joining a support group. Your doctor may be able to suggest a group for you.  Keep all follow-up visits as told by your doctor. This is important. Where to find more information:  [employer] on Mental Illness:  www.nami.org  [place] [employer] of Mental  Health:  http://www.maynard.net/  National Suicide Prevention Lifeline:  9155919913. This is free, 24-hour help. Contact a doctor if:  Your symptoms get worse.  You have new symptoms. Get help right away if:  You self-harm.  You see, hear, taste, smell, or feel things that are not present (hallucinate). If you ever feel like you may hurt yourself or others, or have thoughts about taking your own life, get help right away. You can go to your nearest emergency department or call:  Your local emergency services (911 in the [place]  A suicide crisis helpline, such as the National Suicide Prevention Lifeline:  614-865-5488. This is open 24 hours a day. This information is not intended to replace advice given to you by your health care provider. Make sure you discuss any questions you have with your health care provider. Document Released: 02/03/2015 Document Revised: 11/09/2015 Document Reviewed: 11/09/2015 Elsevier Interactive Patient Education  2017 [employer]. First Trimester of Pregnancy The first trimester of pregnancy is from week 1 until the end of week 12 (months 1 through 3). A week after a sperm fertilizes an egg, the egg will implant on the wall of the uterus. This embryo will begin to develop into a baby. Genes from you and your partner are forming the baby. The female genes determine whether the baby is a boy or a girl. At 6-8 weeks, the eyes and face are formed, and the heartbeat can be seen on ultrasound. At the end of 12 weeks, all the baby's organs are  formed.  Now that you are pregnant, you will want to do everything you can to have a healthy baby. Two of the most important things are to get good prenatal care and to follow your health care provider's instructions. Prenatal care is all the medical care you receive before the baby's birth. This care will help prevent, find, and treat any problems during the pregnancy and childbirth. BODY CHANGES Your body goes through many  changes during pregnancy. The changes vary from woman to woman.   You may gain or lose a couple of pounds at first.  You may feel sick to your stomach (nauseous) and throw up (vomit). If the vomiting is uncontrollable, call your health care provider.  You may tire easily.  You may develop headaches that can be relieved by medicines approved by your health care provider.  You may urinate more often. Painful urination may mean you have a bladder infection.  You may develop heartburn as a result of your pregnancy.  You may develop constipation because certain hormones are causing the muscles that push waste through your intestines to slow down.  You may develop hemorrhoids or swollen, bulging veins (varicose veins).  Your breasts may begin to grow larger and become tender. Your nipples may stick out more, and the tissue that surrounds them (areola) may become darker.  Your gums may bleed and may be sensitive to brushing and flossing.  Dark spots or blotches (chloasma, mask of pregnancy) may develop on your face. This will likely fade after the baby is born.  Your menstrual periods will stop.  You may have a loss of appetite.  You may develop cravings for certain kinds of food.  You may have changes in your emotions from day to day, such as being excited to be pregnant or being concerned that something may go wrong with the pregnancy and baby.  You may have more vivid and strange dreams.  You may have changes in your hair. These can include thickening of your hair, rapid growth, and changes in texture. Some women also have hair loss during or after pregnancy, or hair that feels dry or thin. Your hair will most likely return to normal after your baby is born. WHAT TO EXPECT AT YOUR PRENATAL VISITS During a routine prenatal visit:  You will be weighed to make sure you and the baby are growing normally.  Your blood pressure will be taken.  Your abdomen will be measured to track  your baby's growth.  The fetal heartbeat will be listened to starting around week 10 or 12 of your pregnancy.  Test results from any previous visits will be discussed. Your health care provider may ask you:  How you are feeling.  If you are feeling the baby move.  If you have had any abnormal symptoms, such as leaking fluid, bleeding, severe headaches, or abdominal cramping.  If you are using any tobacco products, including cigarettes, chewing tobacco, and electronic cigarettes.  If you have any questions. Other tests that may be performed during your first trimester include:  Blood tests to find your blood type and to check for the presence of any previous infections. They will also be used to check for low iron levels (anemia) and Rh antibodies. Later in the pregnancy, blood tests for diabetes will be done along with other tests if problems develop.  Urine tests to check for infections, diabetes, or protein in the urine.  An ultrasound to confirm the proper growth and development of the  baby.  An amniocentesis to check for possible genetic problems.  Fetal screens for spina bifida and Down syndrome.  You may need other tests to make sure you and the baby are doing well.  HIV (human immunodeficiency virus) testing. Routine prenatal testing includes screening for HIV, unless you choose not to have this test. HOME CARE INSTRUCTIONS  Medicines   Follow your health care provider's instructions regarding medicine use. Specific medicines may be either safe or unsafe to take during pregnancy.  Take your prenatal vitamins as directed.  If you develop constipation, try taking a stool softener if your health care provider approves. Diet   Eat regular, well-balanced meals. Choose a variety of foods, such as meat or vegetable-based protein, fish, milk and low-fat dairy products, vegetables, fruits, and whole grain breads and cereals. Your health care provider will help you determine the  amount of weight gain that is right for you.  Avoid raw meat and uncooked cheese. These carry germs that can cause birth defects in the baby.  Eating four or five small meals rather than three large meals a day may help relieve nausea and vomiting. If you start to feel nauseous, eating a few soda crackers can be helpful. Drinking liquids between meals instead of during meals also seems to help nausea and vomiting.  If you develop constipation, eat more high-fiber foods, such as fresh vegetables or fruit and whole grains. Drink enough fluids to keep your urine clear or pale yellow. Activity and Exercise   Exercise only as directed by your health care provider. Exercising will help you:  Control your weight.  Stay in shape.  Be prepared for labor and delivery.  Experiencing pain or cramping in the lower abdomen or low back is a good sign that you should stop exercising. Check with your health care provider before continuing normal exercises.  Try to avoid standing for long periods of time. Move your legs often if you must stand in one place for a long time.  Avoid heavy lifting.  Wear low-heeled shoes, and practice good posture.  You may continue to have sex unless your health care provider directs you otherwise. Relief of Pain or Discomfort   Wear a good support bra for breast tenderness.   Take warm sitz baths to soothe any pain or discomfort caused by hemorrhoids. Use hemorrhoid cream if your health care provider approves.   Rest with your legs elevated if you have leg cramps or low back pain.  If you develop varicose veins in your legs, wear support hose. Elevate your feet for 15 minutes, 3-4 times a day. Limit salt in your diet. Prenatal Care   Schedule your prenatal visits by the twelfth week of pregnancy. They are usually scheduled monthly at first, then more often in the last 2 months before delivery.  Write down your questions. Take them to your prenatal  visits.  Keep all your prenatal visits as directed by your health care provider. Safety   Wear your seat belt at all times when driving.  Make a list of emergency phone numbers, including numbers for family, friends, the hospital, and police and fire departments. General Tips   Ask your health care provider for a referral to a local prenatal education class. Begin classes no later than at the beginning of month 6 of your pregnancy.  Ask for help if you have counseling or nutritional needs during pregnancy. Your health care provider can offer advice or refer you to specialists for help with  various needs.  Do not use hot tubs, steam rooms, or saunas.  Do not douche or use tampons or scented sanitary pads.  Do not cross your legs for long periods of time.  Avoid cat litter boxes and soil used by cats. These carry germs that can cause birth defects in the baby and possibly loss of the fetus by miscarriage or stillbirth.  Avoid all smoking, herbs, alcohol, and medicines not prescribed by your health care provider. Chemicals in these affect the formation and growth of the baby.  Do not use any tobacco products, including cigarettes, chewing tobacco, and electronic cigarettes. If you need help quitting, ask your health care provider. You may receive counseling support and other resources to help you quit.  Schedule a dentist appointment. At home, brush your teeth with a soft toothbrush and be gentle when you floss. SEEK MEDICAL CARE IF:   You have dizziness.  You have mild pelvic cramps, pelvic pressure, or nagging pain in the abdominal area.  You have persistent nausea, vomiting, or diarrhea.  You have a bad smelling vaginal discharge.  You have pain with urination.  You notice increased swelling in your face, hands, legs, or ankles. SEEK IMMEDIATE MEDICAL CARE IF:   You have a fever.  You are leaking fluid from your vagina.  You have spotting or bleeding from your  vagina.  You have severe abdominal cramping or pain.  You have rapid weight gain or loss.  You vomit blood or material that looks like coffee grounds.  You are exposed to [place] measles and have never had them.  You are exposed to fifth disease or chickenpox.  You develop a severe headache.  You have shortness of breath.  You have any kind of trauma, such as from a fall or a car accident. This information is not intended to replace advice given to you by your health care provider. Make sure you discuss any questions you have with your health care provider. Document Released: 02/16/2001 Document Revised: 03/15/2014 Document Reviewed: 01/02/2013 Elsevier Interactive Patient Education  2017 [employer]

## 2016-03-18 LAB — GC/CHLAMYDIA PROBE AMP
CT Probe RNA: NOT DETECTED
GC Probe RNA: NOT DETECTED

## 2016-03-26 ENCOUNTER — Telehealth: Payer: Self-pay | Admitting: *Deleted

## 2016-03-26 NOTE — Telephone Encounter (Signed)
Mother denise called and left message in triage voicemail stating pt is 8 weeks preganant and has chills, coughing, 99.9 fever, I called back and told best to be seen at urgent care. Mother verbalized she understood.

## 2016-04-27 LAB — OB RESULTS CONSOLE RUBELLA ANTIBODY, IGM: Rubella: NON-IMMUNE/NOT IMMUNE

## 2016-04-27 LAB — OB RESULTS CONSOLE VARICELLA ZOSTER ANTIBODY, IGG: VARICELLA IGG: IMMUNE

## 2016-04-27 LAB — OB RESULTS CONSOLE HIV ANTIBODY (ROUTINE TESTING): HIV: NONREACTIVE

## 2016-04-27 LAB — OB RESULTS CONSOLE RPR: RPR: NONREACTIVE

## 2016-04-27 LAB — OB RESULTS CONSOLE HEPATITIS B SURFACE ANTIGEN: HEP B S AG: NEGATIVE

## 2016-04-27 LAB — OB RESULTS CONSOLE GC/CHLAMYDIA
Chlamydia: NEGATIVE
Gonorrhea: NEGATIVE

## 2016-10-11 ENCOUNTER — Inpatient Hospital Stay
Admission: EM | Admit: 2016-10-11 | Discharge: 2016-10-11 | Disposition: A | Discharge status: Home / Self Care | Payer: 59 | Attending: Obstetrics and Gynecology | Admitting: Obstetrics and Gynecology

## 2016-10-11 DIAGNOSIS — Z3A36 36 weeks gestation of pregnancy: Secondary | ICD-10-CM | POA: Insufficient documentation

## 2016-10-11 DIAGNOSIS — R109 Unspecified abdominal pain: Secondary | ICD-10-CM | POA: Insufficient documentation

## 2016-10-11 DIAGNOSIS — O26893 Other specified pregnancy related conditions, third trimester: Secondary | ICD-10-CM | POA: Insufficient documentation

## 2016-10-11 DIAGNOSIS — O4703 False labor before 37 completed weeks of gestation, third trimester: Secondary | ICD-10-CM

## 2016-10-11 LAB — URINALYSIS, ROUTINE W REFLEX MICROSCOPIC
BACTERIA UA: NONE SEEN
Bilirubin Urine: NEGATIVE
Glucose, UA: NEGATIVE mg/dL
HGB URINE DIPSTICK: NEGATIVE
KETONES UR: NEGATIVE mg/dL
Nitrite: NEGATIVE
PH: 6 (ref 5.0–8.0)
PROTEIN: 100 mg/dL — AB
Specific Gravity, Urine: 1.027 (ref 1.005–1.030)

## 2016-10-11 LAB — CHLAMYDIA/NGC RT PCR (ARMC ONLY)
CHLAMYDIA TR: NOT DETECTED
N GONORRHOEAE: NOT DETECTED

## 2016-10-11 LAB — WET PREP, GENITAL
Sperm: NONE SEEN
Trich, Wet Prep: NONE SEEN
Yeast Wet Prep HPF POC: NONE SEEN

## 2016-10-11 MED ORDER — PRENATAL MULTIVITAMIN CH: 1.0000 | ORAL_TABLET | Freq: Every day | ORAL | Status: DC

## 2016-10-11 MED ORDER — DOCUSATE SODIUM 100 MG PO CAPS: 100.0000 mg | ORAL_CAPSULE | Freq: Every day | ORAL | Status: DC

## 2016-10-11 MED ORDER — CALCIUM CARBONATE ANTACID 500 MG PO CHEW: 2.0000 | CHEWABLE_TABLET | ORAL | Status: DC | PRN

## 2016-10-11 MED ORDER — ZOLPIDEM TARTRATE 5 MG PO TABS: 5.0000 mg | ORAL_TABLET | Freq: Every evening | ORAL | Status: DC | PRN

## 2016-10-11 MED ORDER — SODIUM CHLORIDE FLUSH 0.9 % IV SOLN
INTRAVENOUS | Status: AC
  Filled 2016-10-11: qty 10

## 2016-10-11 NOTE — OB Triage Note (Signed)
Patient presented to L&D complaining of contractions since this morning at 1am. Denies decreased fetal movement or leaking of fluid. Patient states she has had more "discharge" recently and noticed pink spotting when she was up to the bathroom. She has had intercourse recently.

## 2016-10-11 NOTE — Final Progress Note (Signed)
TRIAGE VISIT with NST   [place] Jimmey Ralpharker is a 21 y.o. G1P0. She is at 3357w3d gestation, presenting with signs of labor.  Indication: Contractions  S: Resting comfortably. No CTX, no VB. Active fetal movement.  O:  BP 132/84   Pulse 87   Temp 98.2 F (36.8 C)   Resp 19   Ht 5' 2.5" (1.588 m)   Wt 201 lb (91.2 kg)   LMP  (LMP Unknown)   BMI 36.18 kg/m  Results for orders placed or performed during the hospital encounter of 10/11/16 (from the past 48 hour(s))  Urinalysis, Routine w reflex microscopic   Collection Time: 10/11/16  4:37 PM  Result Value Ref Range   Color, Urine YELLOW (A) YELLOW   APPearance CLOUDY (A) CLEAR   Specific Gravity, Urine 1.027 1.005 - 1.030   pH 6.0 5.0 - 8.0   Glucose, UA NEGATIVE NEGATIVE mg/dL   Hgb urine dipstick NEGATIVE NEGATIVE   Bilirubin Urine NEGATIVE NEGATIVE   Ketones, ur NEGATIVE NEGATIVE mg/dL   Protein, ur 161100 (A) NEGATIVE mg/dL   Nitrite NEGATIVE NEGATIVE   Leukocytes, UA SMALL (A) NEGATIVE   RBC / HPF 0-5 0 - 5 RBC/hpf   WBC, UA 6-30 0 - 5 WBC/hpf   Bacteria, UA NONE SEEN NONE SEEN   Squamous Epithelial / LPF TOO NUMEROUS TO COUNT (A) NONE SEEN   Mucous PRESENT    Non Squamous Epithelial 0-5 (A) NONE SEEN     Gen: NAD, AAOx3      Abd: FNTTP      Ext: Non-tender, Nonedmeatous    NST/FHT: 140, mod var, +accels, no decel TOCO: irritability WRU:[id number]:Dilation: 1 Effacement (%): 40, 50 Cervical Position: Posterior Station: -3 Presentation: Undeterminable Exam by:: M.Newton,RN  NST: Reactive. See FHT above for particulars.  A/P:  21 y.o. G1P0 5857w3d with abdominal pain.   Labor: not present.   Wet prep, UA, GC/CT and GBS pending. If abnormal will treat. Otherwise, d/c home.  F/U tomorrow as scheduled.  Fetal Wellbeing: NST reactive Reassuring Cat 1 tracing.  D/c home stable, precautions reviewed, follow-up as scheduled.

## 2016-10-13 LAB — CULTURE, BETA STREP (GROUP B ONLY)

## 2016-10-13 LAB — OB RESULTS CONSOLE GBS: GBS: NEGATIVE

## 2016-10-19 ENCOUNTER — Inpatient Hospital Stay
Admission: EM | Admit: 2016-10-19 | Discharge: 2016-10-24 | DRG: 765 | Disposition: A | Discharge status: Home / Self Care | Payer: 59 | Attending: Obstetrics and Gynecology | Admitting: Obstetrics and Gynecology

## 2016-10-19 DIAGNOSIS — Z91040 Latex allergy status: Secondary | ICD-10-CM

## 2016-10-19 DIAGNOSIS — Z3A37 37 weeks gestation of pregnancy: Secondary | ICD-10-CM

## 2016-10-19 DIAGNOSIS — F419 Anxiety disorder, unspecified: Secondary | ICD-10-CM | POA: Diagnosis present

## 2016-10-19 DIAGNOSIS — F339 Major depressive disorder, recurrent, unspecified: Secondary | ICD-10-CM | POA: Diagnosis present

## 2016-10-19 DIAGNOSIS — Z87891 Personal history of nicotine dependence: Secondary | ICD-10-CM

## 2016-10-19 DIAGNOSIS — F1490 Cocaine use, unspecified, uncomplicated: Secondary | ICD-10-CM | POA: Diagnosis present

## 2016-10-19 DIAGNOSIS — D62 Acute posthemorrhagic anemia: Secondary | ICD-10-CM | POA: Diagnosis not present

## 2016-10-19 DIAGNOSIS — O99324 Drug use complicating childbirth: Secondary | ICD-10-CM | POA: Diagnosis present

## 2016-10-19 DIAGNOSIS — F1290 Cannabis use, unspecified, uncomplicated: Secondary | ICD-10-CM | POA: Diagnosis present

## 2016-10-19 DIAGNOSIS — O1404 Mild to moderate pre-eclampsia, complicating childbirth: Principal | ICD-10-CM | POA: Diagnosis present

## 2016-10-19 DIAGNOSIS — O99344 Other mental disorders complicating childbirth: Secondary | ICD-10-CM | POA: Diagnosis present

## 2016-10-19 DIAGNOSIS — O1490 Unspecified pre-eclampsia, unspecified trimester: Secondary | ICD-10-CM | POA: Diagnosis present

## 2016-10-19 DIAGNOSIS — O9081 Anemia of the puerperium: Secondary | ICD-10-CM | POA: Diagnosis not present

## 2016-10-19 HISTORY — DX: Major depressive disorder, single episode, unspecified: F32.9

## 2016-10-19 HISTORY — DX: Anxiety disorder, unspecified: F41.9

## 2016-10-19 HISTORY — DX: Depression, unspecified: F32.A

## 2016-10-19 LAB — COMPREHENSIVE METABOLIC PANEL
ALK PHOS: 117 U/L (ref 38–126)
ALT: 11 U/L — ABNORMAL LOW (ref 14–54)
ANION GAP: 5 (ref 5–15)
AST: 21 U/L (ref 15–41)
Albumin: 2.7 g/dL — ABNORMAL LOW (ref 3.5–5.0)
BILIRUBIN TOTAL: 0.8 mg/dL (ref 0.3–1.2)
BUN: 11 mg/dL (ref 6–20)
CALCIUM: 8.4 mg/dL — AB (ref 8.9–10.3)
CO2: 24 mmol/L (ref 22–32)
Chloride: 109 mmol/L (ref 101–111)
Creatinine, Ser: 0.87 mg/dL (ref 0.44–1.00)
Glucose, Bld: 80 mg/dL (ref 65–99)
POTASSIUM: 4.3 mmol/L (ref 3.5–5.1)
Sodium: 138 mmol/L (ref 135–145)
TOTAL PROTEIN: 6 g/dL — AB (ref 6.5–8.1)

## 2016-10-19 LAB — CBC
HEMATOCRIT: 28.9 % — AB (ref 35.0–47.0)
HEMOGLOBIN: 9 g/dL — AB (ref 12.0–16.0)
MCH: 21.6 pg — AB (ref 26.0–34.0)
MCHC: 31 g/dL — AB (ref 32.0–36.0)
MCV: 69.5 fL — AB (ref 80.0–100.0)
Platelets: 194 10*3/uL (ref 150–440)
RBC: 4.15 MIL/uL (ref 3.80–5.20)
RDW: 19.2 % — AB (ref 11.5–14.5)
WBC: 12 10*3/uL — ABNORMAL HIGH (ref 3.6–11.0)

## 2016-10-19 LAB — PROTEIN / CREATININE RATIO, URINE
Creatinine, Urine: 419 mg/dL
PROTEIN CREATININE RATIO: 1.51 mg/mg{creat} — AB (ref 0.00–0.15)
Total Protein, Urine: 634 mg/dL

## 2016-10-19 NOTE — Progress Notes (Signed)
Patient ID: Melissa Khan, female   DOB: 04/13/1995, 21 y.o.   MRN: [id number]  Melissa Khan is a 21 y.o. female. She is at 6577w4d gestation. No LMP recorded (lmp unknown). Patient is pregnant. Estimated Date of Delivery: 11/05/16  [facility] Chief complaint:sent from clinic with mildly elevated BP and lower extremity edema Some dizziness with standing ( intermittent ) No scotomata , mild h/a for 2-3 weeks   S: Resting comfortably. no CTX, no VB.no LOF,  Active fetal movement.   Maternal Medical History:   Past Medical History:  Diagnosis Date  . ADD (attention deficit disorder)   . Anxiety   . Depression     Past Surgical History:  Procedure Laterality Date  . EXTERNAL EAR SURGERY    . WISDOM TOOTH EXTRACTION      Allergies  Allergen Reactions  . Shrimp [Shellfish Allergy] Hives  . Tramadol Hives  . Latex Itching and Rash    Prior to Admission medications   Medication Sig Start Date End Date Taking? Authorizing Provider  metroNIDAZOLE (FLAGYL) 500 MG tablet Take 500 mg by mouth 2 (two) times daily. For 7 days   Yes [provider]     Social History: She  reports that she quit smoking about 2 years ago. She has never used smokeless tobacco. She reports that she uses drugs, including Marijuana. She reports that she does not drink alcohol.  Family History: family history includes Breast cancer in her paternal aunt.  no history of gyn cancers  Review of Systems: A full review of systems was performed and negative except as noted in the HPI.   Eyes: no vision changes  Ears : no ear infections  Endocrine: no thyroid disease  Pulm: no sob  CV: no chest pain  GI: no hematochezia : GYN : no STD .   O:  BP 122/85   Pulse 81   Temp 98.1 F (36.7 C) (Oral)   Resp 20   Ht 5\' 2"  (1.575 m)   Wt 93.9 kg (207 lb)   LMP  (LMP Unknown)   SpO2 100%   BMI 37.86 kg/m  Results for orders placed or performed during the hospital  encounter of 10/19/16 (from the past 48 hour(s))  Protein / creatinine ratio, urine   Collection Time: 10/19/16  6:18 PM  Result Value Ref Range   Creatinine, Urine 419 mg/dL   Total Protein, Urine 634 mg/dL   Protein Creatinine Ratio 1.51 (H) 0.00 - 0.15 mg/mg[Cre]  CBC   Collection Time: 10/19/16  6:34 PM  Result Value Ref Range   WBC 12.0 (H) 3.6 - 11.0 K/uL   RBC 4.15 3.80 - 5.20 MIL/uL   Hemoglobin 9.0 (L) 12.0 - 16.0 g/dL   HCT 46.928.9 (L) 62.935.0 - 52.847.0 %   MCV 69.5 (L) 80.0 - 100.0 fL   MCH 21.6 (L) 26.0 - 34.0 pg   MCHC 31.0 (L) 32.0 - 36.0 g/dL   RDW 41.319.2 (H) 24.411.5 - 01.014.5 %   Platelets 194 150 - 440 K/uL  Comprehensive metabolic panel   Collection Time: 10/19/16  6:34 PM  Result Value Ref Range   Sodium 138 135 - 145 mmol/L   Potassium 4.3 3.5 - 5.1 mmol/L   Chloride 109 101 - 111 mmol/L   CO2 24 22 - 32 mmol/L   Glucose, Bld 80 65 - 99 mg/dL   BUN 11 6 - 20 mg/dL   Creatinine, Ser 2.720.87 0.44 -  1.00 mg/dL   Calcium 8.4 (L) 8.9 - 10.3 mg/dL   Total Protein 6.0 (L) 6.5 - 8.1 g/dL   Albumin 2.7 (L) 3.5 - 5.0 g/dL   AST 21 15 - 41 U/L   ALT 11 (L) 14 - 54 U/L   Alkaline Phosphatase 117 38 - 126 U/L   Total Bilirubin 0.8 0.3 - 1.2 mg/dL   GFR calc non Af Amer >60 >60 mL/min   GFR calc Af Amer >60 >60 mL/min   Anion gap 5 5 - 15     Constitutional: NAD, AAOx3  BP 122/90 --> 129/81--> 122/85 HE/ENT: extraocular movements grossly intact, moist mucous membranes CV: RRR PULM: nl respiratory effort, CTABL     Abd: gravid, non-tender, non-distended, soft      Ext: Non-tender, Nonedmeatous   Psych: mood appropriate, speech normal Pelvic deferred  NST: Reactive  Baseline:140  Variability: moderate Accelerations present x >2 Decelerations absent  no CTX  Time    A/P: 21 y.o. [redacted]w[redacted]d here for antenatal surveillance for preeclampsia  Will watch overnight .  If BP meet criteria for Preeclampsia in am Induce labor ----- Jennell Corner  MD Attending  Obstetrician and Gynecologist [facility], Department of OB/GYN [facility]

## 2016-10-19 NOTE — OB Triage Note (Signed)
Pt sent from office for preeclampsia workup. Pt states she has experienced "floaters, swelling, headache." Pt has 2+ reflexes, 2+ pitting edema, and lungs are clear. Denies any NVD or LOF. Reports light spotting. Reports positive fetal movement. Will continue to monitor.

## 2016-10-20 DIAGNOSIS — D62 Acute posthemorrhagic anemia: Secondary | ICD-10-CM | POA: Diagnosis not present

## 2016-10-20 DIAGNOSIS — F1490 Cocaine use, unspecified, uncomplicated: Secondary | ICD-10-CM | POA: Diagnosis present

## 2016-10-20 DIAGNOSIS — F339 Major depressive disorder, recurrent, unspecified: Secondary | ICD-10-CM | POA: Diagnosis present

## 2016-10-20 DIAGNOSIS — Z91040 Latex allergy status: Secondary | ICD-10-CM | POA: Diagnosis not present

## 2016-10-20 DIAGNOSIS — O1404 Mild to moderate pre-eclampsia, complicating childbirth: Secondary | ICD-10-CM | POA: Diagnosis present

## 2016-10-20 DIAGNOSIS — O9081 Anemia of the puerperium: Secondary | ICD-10-CM | POA: Diagnosis not present

## 2016-10-20 DIAGNOSIS — O1490 Unspecified pre-eclampsia, unspecified trimester: Secondary | ICD-10-CM | POA: Diagnosis present

## 2016-10-20 DIAGNOSIS — F419 Anxiety disorder, unspecified: Secondary | ICD-10-CM | POA: Diagnosis present

## 2016-10-20 DIAGNOSIS — Z3A37 37 weeks gestation of pregnancy: Secondary | ICD-10-CM | POA: Diagnosis not present

## 2016-10-20 DIAGNOSIS — O1494 Unspecified pre-eclampsia, complicating childbirth: Secondary | ICD-10-CM | POA: Diagnosis present

## 2016-10-20 DIAGNOSIS — O99324 Drug use complicating childbirth: Secondary | ICD-10-CM | POA: Diagnosis present

## 2016-10-20 DIAGNOSIS — F1290 Cannabis use, unspecified, uncomplicated: Secondary | ICD-10-CM | POA: Diagnosis present

## 2016-10-20 DIAGNOSIS — O99344 Other mental disorders complicating childbirth: Secondary | ICD-10-CM | POA: Diagnosis present

## 2016-10-20 DIAGNOSIS — Z87891 Personal history of nicotine dependence: Secondary | ICD-10-CM | POA: Diagnosis not present

## 2016-10-20 LAB — URINE DRUG SCREEN, QUALITATIVE (ARMC ONLY)
AMPHETAMINES, UR SCREEN: NOT DETECTED
BARBITURATES, UR SCREEN: NOT DETECTED
Benzodiazepine, Ur Scrn: NOT DETECTED
CANNABINOID 50 NG, UR ~~LOC~~: POSITIVE — AB
COCAINE METABOLITE, UR ~~LOC~~: NOT DETECTED
MDMA (Ecstasy)Ur Screen: NOT DETECTED
METHADONE SCREEN, URINE: NOT DETECTED
OPIATE, UR SCREEN: NOT DETECTED
PHENCYCLIDINE (PCP) UR S: NOT DETECTED
TRICYCLIC, UR SCREEN: NOT DETECTED

## 2016-10-20 LAB — CBC
HCT: 29.8 % — ABNORMAL LOW (ref 35.0–47.0)
Hemoglobin: 9.3 g/dL — ABNORMAL LOW (ref 12.0–16.0)
MCH: 21.6 pg — AB (ref 26.0–34.0)
MCHC: 31.3 g/dL — AB (ref 32.0–36.0)
MCV: 69.1 fL — AB (ref 80.0–100.0)
PLATELETS: 185 10*3/uL (ref 150–440)
RBC: 4.31 MIL/uL (ref 3.80–5.20)
RDW: 19 % — AB (ref 11.5–14.5)
WBC: 12.7 10*3/uL — AB (ref 3.6–11.0)

## 2016-10-20 LAB — CHLAMYDIA/NGC RT PCR (ARMC ONLY)
Chlamydia Tr: NOT DETECTED
N GONORRHOEAE: NOT DETECTED

## 2016-10-20 LAB — TYPE AND SCREEN
ABO/RH(D): A POS
Antibody Screen: NEGATIVE

## 2016-10-20 MED ORDER — BUTORPHANOL TARTRATE 2 MG/ML IJ SOLN: 1.0000 mg | INTRAMUSCULAR | Status: DC | PRN

## 2016-10-20 MED ORDER — MISOPROSTOL 25 MCG QUARTER TABLET
25.0000 ug | ORAL_TABLET | ORAL | Status: DC | PRN
  Administered 2016-10-20 – 2016-10-21 (×4): 25 ug via VAGINAL
  Filled 2016-10-20 (×6): qty 1

## 2016-10-20 MED ORDER — ONDANSETRON HCL 4 MG/2ML IJ SOLN
4.0000 mg | Freq: Four times a day (QID) | INTRAMUSCULAR | Status: DC | PRN
Start: 2016-10-20 — End: 2016-10-21

## 2016-10-20 MED ORDER — LIDOCAINE HCL (PF) 1 % IJ SOLN
30.0000 mL | INTRAMUSCULAR | Status: DC | PRN
  Filled 2016-10-20: qty 30

## 2016-10-20 MED ORDER — SOD CITRATE-CITRIC ACID 500-334 MG/5ML PO SOLN
30.0000 mL | ORAL | Status: DC | PRN
  Filled 2016-10-20: qty 15

## 2016-10-20 MED ORDER — LABETALOL HCL 5 MG/ML IV SOLN
20.0000 mg | INTRAVENOUS | Status: DC | PRN
  Filled 2016-10-20: qty 16

## 2016-10-20 MED ORDER — LACTATED RINGERS IV SOLN
500.0000 mL | INTRAVENOUS | Status: DC | PRN
  Administered 2016-10-21: 1000 mL via INTRAVENOUS

## 2016-10-20 MED ORDER — OXYTOCIN 10 UNIT/ML IJ SOLN
INTRAMUSCULAR | Status: AC
  Filled 2016-10-20: qty 2

## 2016-10-20 MED ORDER — TERBUTALINE SULFATE 1 MG/ML IJ SOLN
0.2500 mg | Freq: Once | INTRAMUSCULAR | Status: DC | PRN
Start: 2016-10-20 — End: 2016-10-21

## 2016-10-20 MED ORDER — MISOPROSTOL 200 MCG PO TABS
ORAL_TABLET | ORAL | Status: AC
  Administered 2016-10-20: 25 ug via VAGINAL
  Filled 2016-10-20: qty 4

## 2016-10-20 MED ORDER — SODIUM CHLORIDE FLUSH 0.9 % IV SOLN
INTRAVENOUS | Status: AC
  Filled 2016-10-20: qty 10

## 2016-10-20 MED ORDER — ACETAMINOPHEN 325 MG PO TABS: 650.0000 mg | ORAL_TABLET | ORAL | Status: DC | PRN

## 2016-10-20 MED ORDER — OXYTOCIN 40 UNITS IN LACTATED RINGERS INFUSION - SIMPLE MED
2.5000 [IU]/h | INTRAVENOUS | Status: DC
  Administered 2016-10-21: 2.5 [IU]/h via INTRAVENOUS
  Filled 2016-10-20 (×3): qty 1000

## 2016-10-20 MED ORDER — AMMONIA AROMATIC IN INHA
RESPIRATORY_TRACT | Status: AC
  Filled 2016-10-20: qty 10

## 2016-10-20 MED ORDER — OXYTOCIN BOLUS FROM INFUSION: 500.0000 mL | Freq: Once | INTRAVENOUS | Status: DC

## 2016-10-20 MED ORDER — LACTATED RINGERS IV SOLN
INTRAVENOUS | Status: DC
  Administered 2016-10-20 – 2016-10-21 (×4): via INTRAVENOUS

## 2016-10-20 MED ORDER — HYDRALAZINE HCL 20 MG/ML IJ SOLN
10.0000 mg | Freq: Once | INTRAMUSCULAR | Status: DC | PRN
  Filled 2016-10-20: qty 0.5

## 2016-10-20 NOTE — Progress Notes (Signed)
Pt ambulated to LDR 2 for IOL

## 2016-10-20 NOTE — H&P (Signed)
Melissa Khan is a 21 y.o. female presenting for eval for preeclampsia . Pt was watched overnight and BP remained in preeclamptic range . 1.5 gm proteinuria last pm . Will admit and induce for Preeclampsia . OB History    Gravida Para Term Preterm AB Living   1         0   SAB TAB Ectopic Multiple Live Births                 Past Medical History:  Diagnosis Date  . ADD (attention deficit disorder)   . Anxiety   . Depression    Past Surgical History:  Procedure Laterality Date  . EXTERNAL EAR SURGERY    . WISDOM TOOTH EXTRACTION     Family History: family history includes Breast cancer in her paternal aunt. Social History:  reports that she quit smoking about 2 years ago. She has never used smokeless tobacco. She reports that she uses drugs, including Marijuana. She reports that she does not drink alcohol.     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 rate 20, height 5\' 2"  (1.575 m), weight 93.9 kg (207 lb), SpO2 100 %. Exam Physical Exam  Prenatal labs: ABO, Rh:  A+ Antibody:  neg Rubella: Nonimmune (02/20 0000) RPR: Nonreactive (02/20 0000)  HBsAg: Negative (02/20 0000)  HIV: Non-reactive (02/20 0000)  GBS:   neg  Assessment/Plan: 37+5 weeks , Preeclampsia   will admit and induce labor  UDS     Melissa Khan 10/20/2016, 7:58 AM

## 2016-10-20 NOTE — Progress Notes (Signed)
Pt noted to have escalating c/o anxiety, beginning around 2300, 10/19/16. Pt mentions family stress, frequent phone calls from boyfriend, and concerns regarding FOB identity. Pt frequently reassured regarding plan of care and importance of rest tonight. Pt remains awake and agitated regarding social and familial concerns. Will continue to monitor closely.

## 2016-10-20 NOTE — Progress Notes (Signed)
Masae E Jimmey Ralpharker is a 21 y.o. G1P0 at 2959w5d  Subjective: Mild ctx   she is s/p 3 doses of cytotec today .   Objective: BP 139/90 (BP Location: Right Arm)   Pulse 76   Temp 98.8 F (37.1 C) (Oral)   Resp 18   Ht 5\' 2"  (1.575 m)   Wt 93.9 kg (207 lb)   LMP  (LMP Unknown)   SpO2 100%   BMI 37.86 kg/m  No intake/output data recorded. No intake/output data recorded.  FHT:  FHR: 140 bpm, variability: moderate,  accelerations:  Present,  decelerations:  Absent UC:   irregular, every  minutes, regular, every 10 minutes SVE:   Dilation: Closed Effacement (%): 50 Station: -1 Exam by:: De BlanchA. Evans, RN  Labs: Lab Results  Component Value Date   WBC 12.7 (H) 10/20/2016   HGB 9.3 (L) 10/20/2016   HCT 29.8 (L) 10/20/2016   MCV 69.1 (L) 10/20/2016   PLT 185 10/20/2016    Assessment / Plan: Mild preeclampsia  DAY 1 induction  Reassuring fetal monitoring  Will cont with cytotec q 4 hrs  Repeat labs in am  Ihor Austinhomas J Ashe Graybeal 10/20/2016, 9:01 PM

## 2016-10-21 ENCOUNTER — Inpatient Hospital Stay: Payer: 59 | Admitting: Anesthesiology

## 2016-10-21 ENCOUNTER — Encounter
Admission: EM | Disposition: A | Discharge status: Home / Self Care | Payer: Self-pay | Source: Home / Self Care | Attending: Obstetrics and Gynecology

## 2016-10-21 LAB — COMPREHENSIVE METABOLIC PANEL
ALBUMIN: 2.3 g/dL — AB (ref 3.5–5.0)
ALT: 8 U/L — ABNORMAL LOW (ref 14–54)
ANION GAP: 4 — AB (ref 5–15)
AST: 17 U/L (ref 15–41)
Alkaline Phosphatase: 124 U/L (ref 38–126)
BILIRUBIN TOTAL: 1 mg/dL (ref 0.3–1.2)
BUN: 11 mg/dL (ref 6–20)
CHLORIDE: 111 mmol/L (ref 101–111)
CO2: 23 mmol/L (ref 22–32)
Calcium: 7.8 mg/dL — ABNORMAL LOW (ref 8.9–10.3)
Creatinine, Ser: 0.69 mg/dL (ref 0.44–1.00)
GFR calc Af Amer: >60 mL/min (ref >60)
GFR calc non Af Amer: >60 mL/min (ref >60)
GLUCOSE: 81 mg/dL (ref 65–99)
POTASSIUM: 3.7 mmol/L (ref 3.5–5.1)
SODIUM: 138 mmol/L (ref 135–145)
TOTAL PROTEIN: 5.5 g/dL — AB (ref 6.5–8.1)

## 2016-10-21 LAB — CBC
HEMATOCRIT: 28.9 % — AB (ref 35.0–47.0)
HEMOGLOBIN: 9 g/dL — AB (ref 12.0–16.0)
MCH: 21.6 pg — ABNORMAL LOW (ref 26.0–34.0)
MCHC: 31.2 g/dL — AB (ref 32.0–36.0)
MCV: 69.1 fL — AB (ref 80.0–100.0)
Platelets: 186 10*3/uL (ref 150–440)
RBC: 4.19 MIL/uL (ref 3.80–5.20)
RDW: 19.4 % — ABNORMAL HIGH (ref 11.5–14.5)
WBC: 13.4 10*3/uL — ABNORMAL HIGH (ref 3.6–11.0)

## 2016-10-21 LAB — PROTEIN / CREATININE RATIO, URINE
Creatinine, Urine: 49 mg/dL
Protein Creatinine Ratio: 3.98 mg/mg{Cre} — ABNORMAL HIGH (ref 0.00–0.15)
TOTAL PROTEIN, URINE: 195 mg/dL

## 2016-10-21 LAB — RPR: RPR Ser Ql: NONREACTIVE

## 2016-10-21 SURGERY — Surgical Case
Anesthesia: Spinal | Site: Abdomen | Wound class: Clean

## 2016-10-21 MED ORDER — SODIUM CHLORIDE 0.9% FLUSH: 3.0000 mL | INTRAVENOUS | Status: DC | PRN

## 2016-10-21 MED ORDER — SIMETHICONE 80 MG PO CHEW: 80.0000 mg | CHEWABLE_TABLET | ORAL | Status: DC | PRN

## 2016-10-21 MED ORDER — MENTHOL 3 MG MT LOZG
1.0000 | LOZENGE | OROMUCOSAL | Status: DC | PRN
  Filled 2016-10-21: qty 9

## 2016-10-21 MED ORDER — MEPERIDINE HCL 25 MG/ML IJ SOLN: 6.2500 mg | INTRAMUSCULAR | Status: DC | PRN

## 2016-10-21 MED ORDER — OXYTOCIN 40 UNITS IN LACTATED RINGERS INFUSION - SIMPLE MED
2.5000 [IU]/h | INTRAVENOUS | Status: AC
  Administered 2016-10-22: 2.5 [IU]/h via INTRAVENOUS
  Filled 2016-10-21 (×2): qty 1000

## 2016-10-21 MED ORDER — NALBUPHINE HCL 10 MG/ML IJ SOLN: 5.0000 mg | Freq: Once | INTRAMUSCULAR | Status: DC | PRN

## 2016-10-21 MED ORDER — FLEET ENEMA 7-19 GM/118ML RE ENEM: 1.0000 | ENEMA | Freq: Every day | RECTAL | Status: DC | PRN

## 2016-10-21 MED ORDER — WITCH HAZEL-GLYCERIN EX PADS: 1.0000 "application " | MEDICATED_PAD | CUTANEOUS | Status: DC | PRN

## 2016-10-21 MED ORDER — NALOXONE HCL 0.4 MG/ML IJ SOLN: 0.4000 mg | INTRAMUSCULAR | Status: DC | PRN

## 2016-10-21 MED ORDER — DIBUCAINE 1 % RE OINT: 1.0000 "application " | TOPICAL_OINTMENT | RECTAL | Status: DC | PRN

## 2016-10-21 MED ORDER — SUCCINYLCHOLINE CHLORIDE 20 MG/ML IJ SOLN
INTRAMUSCULAR | Status: AC
  Filled 2016-10-21: qty 1

## 2016-10-21 MED ORDER — ONDANSETRON HCL 4 MG/2ML IJ SOLN: 4.0000 mg | Freq: Three times a day (TID) | INTRAMUSCULAR | Status: DC | PRN

## 2016-10-21 MED ORDER — MORPHINE SULFATE (PF) 0.5 MG/ML IJ SOLN
INTRAMUSCULAR | Status: AC
  Filled 2016-10-21: qty 10

## 2016-10-21 MED ORDER — OXYCODONE-ACETAMINOPHEN 5-325 MG PO TABS: 2.0000 | ORAL_TABLET | ORAL | Status: DC | PRN

## 2016-10-21 MED ORDER — MISOPROSTOL 25 MCG QUARTER TABLET: 50.0000 ug | ORAL_TABLET | ORAL | Status: DC

## 2016-10-21 MED ORDER — KETOROLAC TROMETHAMINE 30 MG/ML IJ SOLN: 30.0000 mg | Freq: Four times a day (QID) | INTRAMUSCULAR | Status: DC

## 2016-10-21 MED ORDER — LACTATED RINGERS IV SOLN
INTRAVENOUS | Status: DC
  Administered 2016-10-21: 16:00:00 via INTRAVENOUS

## 2016-10-21 MED ORDER — SOD CITRATE-CITRIC ACID 500-334 MG/5ML PO SOLN
30.0000 mL | ORAL | Status: AC
  Administered 2016-10-21: 30 mL via ORAL

## 2016-10-21 MED ORDER — OXYCODONE HCL 5 MG PO TABS: 10.0000 mg | ORAL_TABLET | ORAL | Status: DC | PRN

## 2016-10-21 MED ORDER — COCONUT OIL OIL
1.0000 "application " | TOPICAL_OIL | Status: DC | PRN
  Filled 2016-10-21: qty 120

## 2016-10-21 MED ORDER — BUPIVACAINE IN DEXTROSE 0.75-8.25 % IT SOLN
INTRATHECAL | Status: AC
  Filled 2016-10-21: qty 2

## 2016-10-21 MED ORDER — DIPHENHYDRAMINE HCL 50 MG/ML IJ SOLN: 12.5000 mg | INTRAMUSCULAR | Status: DC | PRN

## 2016-10-21 MED ORDER — PRENATAL MULTIVITAMIN CH
1.0000 | ORAL_TABLET | Freq: Every day | ORAL | Status: DC
  Administered 2016-10-22 – 2016-10-24 (×3): 1 via ORAL
  Filled 2016-10-21 (×3): qty 1

## 2016-10-21 MED ORDER — ONDANSETRON HCL 4 MG/2ML IJ SOLN
INTRAMUSCULAR | Status: DC | PRN
  Administered 2016-10-21: 4 mg via INTRAVENOUS

## 2016-10-21 MED ORDER — CEFAZOLIN SODIUM-DEXTROSE 2-4 GM/100ML-% IV SOLN
2.0000 g | INTRAVENOUS | Status: AC
  Administered 2016-10-21: 2 g via INTRAVENOUS
  Filled 2016-10-21 (×2): qty 100

## 2016-10-21 MED ORDER — SIMETHICONE 80 MG PO CHEW
80.0000 mg | CHEWABLE_TABLET | Freq: Three times a day (TID) | ORAL | Status: DC
  Administered 2016-10-22 – 2016-10-24 (×8): 80 mg via ORAL
  Filled 2016-10-21 (×8): qty 1

## 2016-10-21 MED ORDER — SODIUM CHLORIDE 0.9 % IV SOLN
INTRAVENOUS | Status: DC | PRN
  Administered 2016-10-21: 70 mL

## 2016-10-21 MED ORDER — SIMETHICONE 80 MG PO CHEW
80.0000 mg | CHEWABLE_TABLET | ORAL | Status: DC
  Administered 2016-10-21 – 2016-10-24 (×3): 80 mg via ORAL
  Filled 2016-10-21 (×3): qty 1

## 2016-10-21 MED ORDER — DIPHENHYDRAMINE HCL 25 MG PO CAPS: 25.0000 mg | ORAL_CAPSULE | Freq: Four times a day (QID) | ORAL | Status: DC | PRN

## 2016-10-21 MED ORDER — MEASLES, MUMPS & RUBELLA VAC ~~LOC~~ INJ
0.5000 mL | INJECTION | Freq: Once | SUBCUTANEOUS | Status: DC
  Filled 2016-10-21: qty 0.5

## 2016-10-21 MED ORDER — LACTATED RINGERS IV SOLN
INTRAVENOUS | Status: DC
  Administered 2016-10-22: 13:00:00 via INTRAVENOUS

## 2016-10-21 MED ORDER — NALBUPHINE HCL 10 MG/ML IJ SOLN: 5.0000 mg | INTRAMUSCULAR | Status: DC | PRN

## 2016-10-21 MED ORDER — MORPHINE SULFATE (PF) 0.5 MG/ML IJ SOLN
INTRAMUSCULAR | Status: DC | PRN
  Administered 2016-10-21: .1 mg via EPIDURAL

## 2016-10-21 MED ORDER — OXYTOCIN 40 UNITS IN LACTATED RINGERS INFUSION - SIMPLE MED
INTRAVENOUS | Status: DC | PRN
  Administered 2016-10-21: 10 mL via INTRAVENOUS

## 2016-10-21 MED ORDER — BISACODYL 10 MG RE SUPP
10.0000 mg | Freq: Every day | RECTAL | Status: DC | PRN
Start: 2016-10-21 — End: 2016-10-24

## 2016-10-21 MED ORDER — DIPHENHYDRAMINE HCL 25 MG PO CAPS: 25.0000 mg | ORAL_CAPSULE | ORAL | Status: DC | PRN

## 2016-10-21 MED ORDER — BUPIVACAINE LIPOSOME 1.3 % IJ SUSP
INTRAMUSCULAR | Status: AC
  Filled 2016-10-21: qty 20

## 2016-10-21 MED ORDER — PROPOFOL 10 MG/ML IV BOLUS
INTRAVENOUS | Status: AC
  Filled 2016-10-21: qty 20

## 2016-10-21 MED ORDER — IBUPROFEN 600 MG PO TABS
600.0000 mg | ORAL_TABLET | Freq: Four times a day (QID) | ORAL | Status: DC
  Administered 2016-10-21: 600 mg via ORAL
  Filled 2016-10-21: qty 1

## 2016-10-21 MED ORDER — SENNOSIDES-DOCUSATE SODIUM 8.6-50 MG PO TABS
2.0000 | ORAL_TABLET | ORAL | Status: DC
  Administered 2016-10-21 – 2016-10-24 (×2): 2 via ORAL
  Filled 2016-10-21 (×2): qty 2

## 2016-10-21 MED ORDER — SODIUM CHLORIDE 0.9 % IJ SOLN
INTRAMUSCULAR | Status: AC
  Filled 2016-10-21: qty 50

## 2016-10-21 MED ORDER — FENTANYL CITRATE (PF) 100 MCG/2ML IJ SOLN
INTRAMUSCULAR | Status: AC
  Filled 2016-10-21: qty 2

## 2016-10-21 MED ORDER — KETOROLAC TROMETHAMINE 30 MG/ML IJ SOLN
30.0000 mg | Freq: Four times a day (QID) | INTRAMUSCULAR | Status: DC
  Administered 2016-10-21 – 2016-10-22 (×2): 30 mg via INTRAVENOUS
  Filled 2016-10-21 (×2): qty 1

## 2016-10-21 MED ORDER — ACETAMINOPHEN 325 MG PO TABS: 650.0000 mg | ORAL_TABLET | ORAL | Status: DC | PRN

## 2016-10-21 MED ORDER — BUPIVACAINE HCL (PF) 0.5 % IJ SOLN
INTRAMUSCULAR | Status: AC
  Filled 2016-10-21: qty 30

## 2016-10-21 MED ORDER — BUPIVACAINE IN DEXTROSE 0.75-8.25 % IT SOLN
INTRATHECAL | Status: DC | PRN
  Administered 2016-10-21: 1.6 mL via INTRATHECAL

## 2016-10-21 MED ORDER — ACETAMINOPHEN 500 MG PO TABS
1000.0000 mg | ORAL_TABLET | Freq: Four times a day (QID) | ORAL | Status: DC
  Administered 2016-10-21: 500 mg via ORAL
  Administered 2016-10-21: 1000 mg via ORAL
  Filled 2016-10-21 (×3): qty 2

## 2016-10-21 MED ORDER — SODIUM CHLORIDE 0.9 % IV SOLN
INTRAVENOUS | Status: DC | PRN
  Administered 2016-10-21: 50 ug/min via INTRAVENOUS

## 2016-10-21 MED ORDER — PHENYLEPHRINE HCL 10 MG/ML IJ SOLN
INTRAMUSCULAR | Status: AC
  Filled 2016-10-21: qty 1

## 2016-10-21 MED ORDER — ONDANSETRON HCL 4 MG/2ML IJ SOLN
INTRAMUSCULAR | Status: AC
  Filled 2016-10-21: qty 2

## 2016-10-21 MED ORDER — BUPIVACAINE HCL (PF) 0.5 % IJ SOLN
INTRAMUSCULAR | Status: DC | PRN
  Administered 2016-10-21: 30 mL

## 2016-10-21 MED ORDER — OXYCODONE HCL 5 MG PO TABS
5.0000 mg | ORAL_TABLET | ORAL | Status: DC | PRN
Start: 2016-10-21 — End: 2016-10-21

## 2016-10-21 MED ORDER — OXYCODONE-ACETAMINOPHEN 5-325 MG PO TABS: 1.0000 | ORAL_TABLET | ORAL | Status: DC | PRN

## 2016-10-21 MED ORDER — FENTANYL CITRATE (PF) 100 MCG/2ML IJ SOLN
INTRAMUSCULAR | Status: DC | PRN
  Administered 2016-10-21: 15 ug via INTRATHECAL

## 2016-10-21 MED ORDER — LACTATED RINGERS IV SOLN
INTRAVENOUS | Status: DC | PRN
  Administered 2016-10-21: 13:00:00 via INTRAVENOUS

## 2016-10-21 MED ORDER — TETANUS-DIPHTH-ACELL PERTUSSIS 5-2.5-18.5 LF-MCG/0.5 IM SUSP: 0.5000 mL | Freq: Once | INTRAMUSCULAR | Status: DC

## 2016-10-21 MED ORDER — BUPIVACAINE LIPOSOME 1.3 % IJ SUSP: 20.0000 mL | Freq: Once | INTRAMUSCULAR | Status: DC

## 2016-10-21 SURGICAL SUPPLY — 26 items
BARRIER ADHS 3X4 INTERCEED (GAUZE/BANDAGES/DRESSINGS) ×3 IMPLANT
CANISTER SUCT 3000ML PPV (MISCELLANEOUS) ×3 IMPLANT
CHLORAPREP W/TINT 26ML (MISCELLANEOUS) ×3 IMPLANT
DRSG OPSITE POSTOP 4X10 (GAUZE/BANDAGES/DRESSINGS) ×3 IMPLANT
DRSG TELFA 3X8 NADH (GAUZE/BANDAGES/DRESSINGS) ×3 IMPLANT
ELECT REM PT RETURN 9FT ADLT (ELECTROSURGICAL) ×3
ELECTRODE REM PT RTRN 9FT ADLT (ELECTROSURGICAL) ×1 IMPLANT
GAUZE SPONGE 4X4 12PLY STRL (GAUZE/BANDAGES/DRESSINGS) ×3 IMPLANT
GOWN STRL REUS W/ TWL LRG LVL3 (GOWN DISPOSABLE) ×3 IMPLANT
GOWN STRL REUS W/TWL LRG LVL3 (GOWN DISPOSABLE) ×6
NS IRRIG 1000ML POUR BTL (IV SOLUTION) ×3 IMPLANT
PAD OB MATERNITY 4.3X12.25 (PERSONAL CARE ITEMS) ×3 IMPLANT
PAD PREP 24X41 OB/GYN DISP (PERSONAL CARE ITEMS) ×3 IMPLANT
SPONGE LAP 18X18 5 PK (GAUZE/BANDAGES/DRESSINGS) ×3 IMPLANT
STAPLER INSORB 30 2030 C-SECTI (MISCELLANEOUS) ×3 IMPLANT
SUT MNCRL 4-0 (SUTURE)
SUT MNCRL 4-0 27XMFL (SUTURE)
SUT PDS AB 1 TP1 96 (SUTURE) ×3 IMPLANT
SUT PLAIN 2 0 XLH (SUTURE) ×3 IMPLANT
SUT PLAIN GUT 2-0 30 C14 SG823 (SUTURE) ×3
SUT VIC AB 0 CT1 36 (SUTURE) ×9 IMPLANT
SUT VIC AB 3-0 SH 27 (SUTURE) ×2
SUT VIC AB 3-0 SH 27X BRD (SUTURE) ×1 IMPLANT
SUT VICRYL 3-0 27IN SH (SUTURE) ×3 IMPLANT
SUTURE MNCRL 4-0 27XMF (SUTURE) IMPLANT
SUTURE PLN GUT2-0 30 C14 SG823 (SUTURE) ×1 IMPLANT

## 2016-10-21 NOTE — Op Note (Signed)
  Cesarean Section Procedure Note  Date of procedure: 10/21/2016   Pre-operative Diagnosis: Intrauterine pregnancy at 2044w6d; preE without severe features; cat II strip remote from delivery  Post-operative Diagnosis: same, delivered.  Procedure:  Primary Low Transverse Cesarean Section through Pfannenstiel incision  Surgeon: Christeen DouglasBethany Mary-Anne Polizzi, MD  Assistant(s):  CST  Anesthesia: Local anesthesia liposomal bupivicaine and Spinal anesthesia  Anesthesiologist: Alver FisherPenwarden, Amy, MD Anesthesiologist: Alver FisherPenwarden, Amy, MD CRNA: Lily KocherPeralta, Nicole, CRNA  Estimated Blood Loss:  750         Drains: none         Total IV Fluids: 500ml  Urine Output: 350ml         Specimens: None         Complications:  None; patient tolerated the procedure well.         Disposition: PACU - hemodynamically stable.         Condition: stable  Findings:  A female infant "Dorian Podommy Lee" in cephalic presentation. Amniotic fluid - Clear  Birth weight 5#12 oz  Apgars of 9 and 9 at one and five minutes respectively.  Intact placenta with a three-vessel cord.  Grossly normal uterus, tubes and ovaries bilaterally. No intraabdominal adhesions were noted.  Indications: non-reassuring fetal status  Procedure Details  The patient was taken to Operating Room, identified as the correct patient and the procedure verified as C-Section Delivery. A formal Time Out was held with all team members present and in agreement.  After induction of anesthesia, the patient was draped and prepped in the usual sterile manner. A Pfannenstiel skin incision was made and carried down through the subcutaneous tissue to the fascia. Fascial incision was made and extended transversely with the Mayo scissors. The fascia was separated from the underlying rectus tissue superiorly and inferiorly. The peritoneum was identified and entered bluntly. Peritoneal incision was extended longitudinally. The utero-vesical peritoneal reflection was incised  transversely and a bladder flap was created digitally.   A low transverse hysterotomy was made. The fetus was delivered atraumatically. The umbilical cord was clamped x2 and cut and the infant was handed to the awaiting pediatricians. The placenta was removed intact and appeared normal, intact, and with a 3-vessel cord.   The uterus was exteriorized and cleared of all clot and debris. The hysterotomy was closed with running sutures of 0-Vicryl. A second imbricating layer was placed with the same suture. Excellent hemostasis was observed. The peritoneal cavity was cleared of all clots and debris. The uterus was returned to the abdomen.   The pelvis was irrigated and again, excellent hemostasis was noted. The fascia was then reapproximated with running sutures of 0 Vicryl.  The subcutaneous tissue was reapproximated with interrupted sutures of 0 Vicry. The skin was reapproximated with a 4-0 Monocryl subcuticular stitch.  20ml (in 30 of 0.5% bupivicaine and 50ml of NSS) of liposomal bupivicaine placed in the fascial and skin lines.  Instrument, sponge, and needle counts were correct prior to the abdominal closure and at the conclusion of the case.   The patient tolerated the procedure well and was transferred to the recovery room in stable condition.   Christeen DouglasBethany Carmelina Balducci, MD 10/21/2016

## 2016-10-21 NOTE — Anesthesia Preprocedure Evaluation (Signed)
Anesthesia Evaluation  Patient identified by MRN, date of birth, ID band Patient awake    Reviewed: Allergy & Precautions, NPO status , Patient's Chart, lab work & pertinent test results  History of Anesthesia Complications Negative for: history of anesthetic complications  Airway Mallampati: III  TM Distance: >3 FB Neck ROM: Full    Dental no notable dental hx.    Pulmonary neg sleep apnea, neg COPD, former smoker,    breath sounds clear to auscultation- rhonchi (-) wheezing      Cardiovascular (-) hypertension(-) CAD and (-) Past MI  Rhythm:Regular Rate:Normal - Systolic murmurs and - Diastolic murmurs    Neuro/Psych PSYCHIATRIC DISORDERS Anxiety Depression negative neurological ROS     GI/Hepatic negative GI ROS, Neg liver ROS,   Endo/Other  negative endocrine ROSneg diabetes  Renal/GU negative Renal ROS     Musculoskeletal negative musculoskeletal ROS (+)   Abdominal (+) + obese, Gravid abdomen  Peds  Hematology negative hematology ROS (+)   Anesthesia Other Findings Past Medical History: No date: ADD (attention deficit disorder) No date: Anxiety No date: Depression   Reproductive/Obstetrics (+) Pregnancy                             Anesthesia Physical Anesthesia Plan  ASA: II  Anesthesia Plan: Spinal   Post-op Pain Management:    Induction:   PONV Risk Score and Plan: 2 and Ondansetron  Airway Management Planned: Natural Airway  Additional Equipment:   Intra-op Plan:   Post-operative Plan:   Informed Consent: I have reviewed the patients History and Physical, chart, labs and discussed the procedure including the risks, benefits and alternatives for the proposed anesthesia with the patient or authorized representative who has indicated his/her understanding and acceptance.   Dental advisory given  Plan Discussed with: Anesthesiologist and CRNA  Anesthesia Plan  Comments: (Preeclampsia w/o severe features, cat 2 FHR tracing, planning for primary c-section as baby is unlikely to tolerate IOL)        Lab Results  Component Value Date   WBC 13.4 (H) 10/21/2016   HGB 9.0 (L) 10/21/2016   HCT 28.9 (L) 10/21/2016   MCV 69.1 (L) 10/21/2016   PLT 186 10/21/2016    Anesthesia Quick Evaluation

## 2016-10-21 NOTE — Progress Notes (Signed)
Cat II strip with late decels with contractions, remote from delivery. Last cytotec held due to lates. Reassuring strip currently, but after discussion with patient decision for expedited delivery was made.  The risks of cesarean section discussed with the patient included but were not limited to: bleeding which may require transfusion or reoperation; infection which may require antibiotics; injury to bowel, bladder, ureters or other surrounding organs; injury to the fetus; need for additional procedures including hysterectomy in the event of a life-threatening hemorrhage; placental abnormalities wth subsequent pregnancies, incisional problems, thromboembolic phenomenon and other postoperative/anesthesia complications. The patient concurred with the proposed plan, giving informed written consent for the procedure.   Patient has been NPO since last night and she will remain NPO for procedure. Anesthesia and OR aware. Preoperative prophylactic antibiotics and SCDs ordered on call to the OR.  To OR when ready.   Patient ID: Melissa Khan, female   DOB: 10/31/1995, 21 y.o.   MRN: [id number]

## 2016-10-21 NOTE — Anesthesia Post-op Follow-up Note (Signed)
Anesthesia QCDR form completed.        

## 2016-10-21 NOTE — Transfer of Care (Signed)
Immediate Anesthesia Transfer of Care Note  Patient: Melissa RumbleAri[place] E Khan  Procedure(s) Performed: Procedure(s) with comments: CESAREAN SECTION (N/A) - Female born @ 1349 Apgars: 9/9 Weight: 5lbs 12 ozs  Patient Location: PACU and Mother/Baby  Anesthesia Type:Spinal  Level of Consciousness: awake, alert  and oriented  Airway & Oxygen Therapy: Patient Spontanous Breathing  Post-op Assessment: Report given to RN and Post -op Vital signs reviewed and stable  Post vital signs: Reviewed and stable  Last Vitals:  Vitals:   10/21/16 1441 10/21/16 1445  BP: 139/90   Pulse:  87  Resp:  13  Temp:  (!) (P) 36.4 C  SpO2: 100     Last Pain:  Vitals:   10/21/16 0725  TempSrc: Oral  PainSc: 0-No pain      Patients Stated Pain Goal: 0 (10/19/16 1751)  Complications: No apparent anesthesia complications

## 2016-10-21 NOTE — Progress Notes (Signed)
Melissa Khan is a 21 y.o. G1P0 at 3018w6d wks with iol/PreE without severe features. BP now normal range, protein increased this morning from admission labs. FHT Cat II strip with mod var but occ variables, several lates with good recovery and +accels  Subjective: Pt anxious but not hurting  Objective: BP 124/86   Pulse (!) 106   Temp 98.2 F (36.8 C) (Oral)   Resp 18   Ht 5\' 2"  (1.575 m)   Wt 207 lb (93.9 kg)   LMP  (LMP Unknown)   SpO2 100%   BMI 37.86 kg/m  No intake/output data recorded. No intake/output data recorded.  FHT:  FHR: 145 bpm, variability: moderate,  accelerations:  Present,  decelerations:  Present lates and variables UC:   irregular, every 2-4 minutes SVE:   Dilation: 1 Effacement (%): 60 Station: -1 Exam by:: Melissa ManifoldB. Melissa Napierala MD  Labs: Lab Results  Component Value Date   WBC 13.4 (H) 10/21/2016   HGB 9.0 (L) 10/21/2016   HCT 28.9 (L) 10/21/2016   MCV 69.1 (L) 10/21/2016   PLT 186 10/21/2016    Assessment / Plan:  - iol for preE with severe features: declined foley bulb, though I recommended. Will try buccal misoprostol 50mcg. Some change in cervix, is now anterior and presenting part in pelvis. - anxiety: pt on no meds at home, but clearly anxiety a large component of her induction. Will try to discuss what I'm seeing with FHT and the induction prior to emergent needs. - Cat II strip. Continue intrauterine measures. Pt aware that if strip deteriorates I will recommend expedited delivery. She is not interested in epidural or cesarean section, and so we discussed pros and cons of these, and her questions were answered. I am concerned that cesarean may be indicated for fetal reasons at some point and her anxiety will impede her understanding of my recommendations, so we did discuss it now. - pain control: declines epidural. Will attempt to ease her labor pains with nitrous and iv meds. Epidural if desired. - GBS unknown. Will try to get gbs status  today.  Melissa DouglasBethany Kuulei Khan 10/21/2016, 8:54 AM

## 2016-10-21 NOTE — Discharge Summary (Signed)
Obstetrical Discharge Summary  Patient Name: Melissa Khan DOB: 05/08/1995 MRN: [id number]  Date of Admission: 10/19/2016 Date of Discharge: 10/24/2016  [facility]   Gestational Age at Delivery: 1003w6d   Antepartum complications:  Mild preeclampsia Anxiety/Depression  Admitting Diagnosis: iol for mild PreE  Secondary Diagnosis: Patient Active Problem List   Diagnosis Date Noted  . Preeclampsia 10/20/2016  . Severe recurrent major depressive disorder with psychotic features (HCC)   . Severe episode of recurrent major depressive disorder, without psychotic features (HCC) 09/08/2015  . Sedative, hypnotic or anxiolytic use disorder, mild, abuse 09/08/2015  . Cannabis use disorder, moderate, dependence (HCC) 09/08/2015  . Suicidal ideation 09/08/2015  . Self-inflicted laceration of wrist 09/08/2015    Augmentation: Cytotec Complications: None Intrapartum complications/course:  Intrauterine pregnancy at 743w6d; preE without severe features; cat II strip remote from delivery  Date of Delivery: 10/21/16 Delivered By: Christeen DouglasBethany Huck Ashworth Delivery Type: primary cesarean section, low transverse incision Anesthesia: spinal Placenta: extracted Laceration: n/a Episiotomy: none Newborn Data: Live born female "Dorian Podommy Lee" Birth Weight: 5 lb 12.4 oz (2620 g) APGAR: 9, 9    Discharge Physical Exam:  BP 127/71 (BP Location: Right Arm)   Pulse 83   Temp 98.1 F (36.7 C) (Oral)   Resp 16   Ht 5\' 2"  (1.575 m)   Wt 207 lb (93.9 kg)   LMP  (LMP Unknown)   SpO2 99%   BMI 37.86 kg/m   General: NAD CV: RRR Pulm: CTABL, nl effort ABD: s/nd/nt, fundus firm and below the umbilicus Lochia: moderate Incision: c/d/i  DVT Evaluation: LE non-ttp, no evidence of DVT on exam.  Hemoglobin  Date Value Ref Range Status  10/24/2016 6.9 (L) 12.0 - 16.0 g/dL Final   HCT  Date Value Ref Range Status  10/24/2016 21.8 (L) 35.0 - 47.0 % Final    Post partum course:   Complicated by acute blood loss anemia: pt asx. po iron TID, pt declines blood transfusion. H/H stable over 3 days. Vitals stable, no evidence of cardiac compensation or internal bleeding. - will discharge home on iron and colace. Discussion with family re: expectation for fatigue. I've asked her to return if 1 week still fatigue, or concerns with dizziness or sx anemia Postpartum Procedures: none Disposition: stable, discharge to home. Baby Feeding: breastmilk (THC positive in the urine, pt last smoked 2-3wks ago) Baby Disposition: home with mom  Contraception: undecided  Prenatal Labs:   ABO, Rh:  A+ Antibody:  neg Rubella: Nonimmune (02/20 0000) RPR: Nonreactive (02/20 0000)  HBsAg: Negative (02/20 0000)  HIV: Non-reactive (02/20 0000)  GBS:   neg   Plan:  Melissa Khan was discharged to home in good condition. [facility] with delivering provider in 6 weeks   Discharge Medications: Allergies as of 10/24/2016      Reactions   Shrimp [shellfish Allergy] Hives   Tramadol Hives   Latex Itching, Rash      Medication List    STOP taking these medications   metroNIDAZOLE 500 MG tablet Commonly known as:  FLAGYL     TAKE these medications   docusate sodium 100 MG capsule Commonly known as:  COLACE Take 1 capsule (100 mg total) by mouth 2 (two) times daily. To keep stools soft, as needed   ferrous sulfate 325 (65 FE) MG tablet Take 1 tablet (325 mg total) by mouth daily with breakfast. Take with Vitamin C   ibuprofen 800 MG tablet Commonly known  as:  ADVIL,MOTRIN Take 1 tablet (800 mg total) by mouth every 8 (eight) hours as needed for moderate pain or cramping.   oxyCODONE-acetaminophen 5-325 MG tablet Commonly known as:  PERCOCET/ROXICET Take 1-2 tablets by mouth every 6 (six) hours as needed for severe pain.         Signed: Christeen Douglas 10/24/16

## 2016-10-21 NOTE — Anesthesia Procedure Notes (Signed)
Spinal  Patient location during procedure: OB Start time: 10/21/2016 1:23 PM End time: 10/21/2016 1:29 PM Staffing Anesthesiologist: Randa Lynn, AMY Resident/CRNA: Dionne Bucy Performed: resident/CRNA  Preanesthetic Checklist Completed: patient identified, site marked, surgical consent, pre-op evaluation, timeout performed, IV checked, risks and benefits discussed and monitors and equipment checked Spinal Block Patient position: sitting Prep: ChloraPrep Patient monitoring: heart rate, continuous pulse ox, blood pressure and cardiac monitor Approach: midline Location: L4-5 Injection technique: single-shot Needle Needle type: Introducer and Pencil-Tip  Needle gauge: 24 G Needle length: 9 cm Additional Notes Negative paresthesia. Negative blood return. Positive free-flowing CSF. Expiration date of kit checked and confirmed. Patient tolerated procedure well, without complications.

## 2016-10-22 ENCOUNTER — Encounter: Payer: Self-pay | Admitting: Obstetrics and Gynecology

## 2016-10-22 LAB — COMPREHENSIVE METABOLIC PANEL
ALK PHOS: 89 U/L (ref 38–126)
ALT: 8 U/L — AB (ref 14–54)
AST: 25 U/L (ref 15–41)
Albumin: 1.8 g/dL — ABNORMAL LOW (ref 3.5–5.0)
Anion gap: 3 — ABNORMAL LOW (ref 5–15)
BILIRUBIN TOTAL: 1 mg/dL (ref 0.3–1.2)
BUN: 16 mg/dL (ref 6–20)
CO2: 23 mmol/L (ref 22–32)
CREATININE: 0.81 mg/dL (ref 0.44–1.00)
Calcium: 7.5 mg/dL — ABNORMAL LOW (ref 8.9–10.3)
Chloride: 108 mmol/L (ref 101–111)
GFR calc Af Amer: >60 mL/min (ref >60)
Glucose, Bld: 87 mg/dL (ref 65–99)
Potassium: 4 mmol/L (ref 3.5–5.1)
Sodium: 134 mmol/L — ABNORMAL LOW (ref 135–145)
TOTAL PROTEIN: 4.4 g/dL — AB (ref 6.5–8.1)

## 2016-10-22 LAB — CBC
HEMATOCRIT: 22.5 % — AB (ref 35.0–47.0)
HEMOGLOBIN: 7.1 g/dL — AB (ref 12.0–16.0)
MCH: 22 pg — ABNORMAL LOW (ref 26.0–34.0)
MCHC: 31.5 g/dL — ABNORMAL LOW (ref 32.0–36.0)
MCV: 69.7 fL — AB (ref 80.0–100.0)
Platelets: 157 10*3/uL (ref 150–440)
RBC: 3.23 MIL/uL — AB (ref 3.80–5.20)
RDW: 19.7 % — ABNORMAL HIGH (ref 11.5–14.5)
WBC: 12.7 10*3/uL — AB (ref 3.6–11.0)

## 2016-10-22 MED ORDER — IBUPROFEN 600 MG PO TABS
600.0000 mg | ORAL_TABLET | Freq: Four times a day (QID) | ORAL | Status: DC
  Administered 2016-10-22 – 2016-10-23 (×4): 600 mg via ORAL
  Filled 2016-10-22 (×4): qty 1

## 2016-10-22 MED ORDER — OXYCODONE HCL 5 MG PO TABS
10.0000 mg | ORAL_TABLET | ORAL | Status: DC | PRN
  Administered 2016-10-22 – 2016-10-23 (×2): 10 mg via ORAL
  Filled 2016-10-22 (×2): qty 2

## 2016-10-22 MED ORDER — FERROUS SULFATE 325 (65 FE) MG PO TABS
325.0000 mg | ORAL_TABLET | Freq: Three times a day (TID) | ORAL | Status: DC
  Administered 2016-10-23 – 2016-10-24 (×6): 325 mg via ORAL
  Filled 2016-10-22 (×6): qty 1

## 2016-10-22 MED ORDER — ACETAMINOPHEN 500 MG PO TABS
1000.0000 mg | ORAL_TABLET | Freq: Four times a day (QID) | ORAL | Status: DC
  Administered 2016-10-22 – 2016-10-24 (×9): 1000 mg via ORAL
  Filled 2016-10-22 (×9): qty 2

## 2016-10-22 MED ORDER — OXYCODONE HCL 5 MG PO TABS
5.0000 mg | ORAL_TABLET | ORAL | Status: DC | PRN
  Administered 2016-10-22 – 2016-10-24 (×5): 5 mg via ORAL
  Filled 2016-10-22 (×5): qty 1

## 2016-10-22 MED ORDER — AMMONIA AROMATIC IN INHA
RESPIRATORY_TRACT | Status: AC
  Filled 2016-10-22: qty 10

## 2016-10-22 NOTE — Anesthesia Postprocedure Evaluation (Signed)
Anesthesia Post Note  Patient: Melissa Khan  Procedure(s) Performed: Procedure(s) (LRB): CESAREAN SECTION (N/A)  Patient location during evaluation: Nursing Unit Anesthesia Type: Spinal Level of consciousness: awake, awake and alert and oriented Pain management: pain level controlled Vital Signs Assessment: post-procedure vital signs reviewed and stable Respiratory status: spontaneous breathing and nonlabored ventilation Cardiovascular status: blood pressure returned to baseline and stable Postop Assessment: no headache and no backache Anesthetic complications: no     Last Vitals:  Vitals:   10/21/16 2347 10/22/16 0336  BP: 135/79 115/79  Pulse: 79 80  Resp: 20 18  Temp: 37.3 C 36.7 C  SpO2: 99% 98%    Last Pain:  Vitals:   10/22/16 0336  TempSrc: Oral  PainSc:                  Ginger Carne

## 2016-10-22 NOTE — Clinical Social Work Maternal (Signed)
  CLINICAL SOCIAL WORK MATERNAL/CHILD NOTE  Patient Details  Name: Melissa Khan MRN: [id number] Date of Birth: [birth year]  Date:  10/22/2016  Clinical Social Worker Initiating Note:  Shela Leff MSW,LCSW Date/ Time Initiated:  10/22/16/      Child's Name:      Legal Guardian:  Mother   Need for Interpreter:  None   Date of Referral:        Reason for Referral:   (Mental Health History and Substance Abuse)   Referral Source:  Physician   Address:     Phone number:      Household Members:  Minor Children, Parents, Siblings, Significant Other, Self   Natural Supports (not living in the home):  Extended Family, Immediate Family   Professional Supports: None   Employment: Unemployed   Type of Work:     Education:      Pensions consultant:  Multimedia programmer   Other Resources:  Physicist, medical , [place] Considerations Which May Impact Care:  none  Strengths:  Ability to meet basic needs , Home prepared for child    Risk Factors/Current Problems:  Substance Use , Mental Health Concerns    Cognitive State:  Alert , Able to Concentrate , Goal Oriented , Insightful    Mood/Affect:  Bright , Calm    CSW Assessment: CSW consulted due to substance abuse during pregnancy and mental health history including a psych hospitalization for suicide attempt. CSW met with patient and her significant other this afternoon. Patient had her mother and grandmother in the room but she asked them to step out during assessment. Patient and significant other/father of baby stated that they live in [place] but are currently staying with father of baby's grandmother. Patient's mother and grandmother also live in [place]. Patient reports that the family is very supportive and will assist with any needs that may arise. Patient stated that they have all necessities for their newborn and have no concerns regarding transportation.   During patient's prenatal care she  was offered and given resources regarding mental health professionals and even discussed with the physician the possibility of starting zoloft after her delivery as a precautionary measure. Patient admits to her history of cutting and suicide attempt but states she has not done any cutting since the hospitalization a year ago. She did see a psychiatrist and therapist but did not like the psychiatrist prescribing so many medications so she stopped going and is not on any medications. Patient states she has done well off the medications. CSW discussed at length with patient resources and actions to take if she should start having reoccurring symptoms of depression or thoughts of cutting. Patient verbalized understanding. She states that she will also have family around her for support as well. In regards to patient's drug use, she admits to using marijuana during the pregnancy but states she used for appetite stimulant. She stated that she has no intentions of using marijuana now. Patient is aware that her infant's cord tissue could still come back positive for marijuana. CSW will follow this and if it does come back positive for marijuana, then CSW will make a DSS CPS report. The report will need to be made to [facility] as this is permanent address.   CSW Plan/Description:  Engineer, mining , Information/Referral to [employer], [place] 10/22/2016, 4:08 PM

## 2016-10-22 NOTE — Progress Notes (Signed)
Dr Renae Gloss called regarding urinary ooutput. Informed OU was a concern in recovery and of fluids ordered. IVF completed and OU still inadequate. Order received to continue to watch hourly and call if hourly OU remains 5-73ml/hr from 0000-0300. CMP added to am labs

## 2016-10-22 NOTE — Progress Notes (Signed)
  Subjective:   Doing well, has ambulated, voided, tolerating PO and pain with PO meds. UOP has improved throughout the day.  Foley removed.  Denies: HA, visual changes, SOB, or RUQ/epigastric pain   Objective:  Blood pressure 116/66, pulse 78, temperature 98.2 F (36.8 C), temperature source Oral, resp. rate 20, height 5\' 2"  (1.575 m), weight 93.9 kg (207 lb), SpO2 96 %.  General: NAD Pulmonary: no increased work of breathing Abdomen: non-distended, non-tender, fundus firm at level of umbilicus Incision: bandage removed, c/d/i Extremities: no edema, no erythema, no tenderness  Results for orders placed or performed during the hospital encounter of 10/19/16 (from the past 24 hour(s))  CBC     Status: Abnormal   Collection Time: 10/22/16  5:16 AM  Result Value Ref Range   WBC 12.7 (H) 3.6 - 11.0 K/uL   RBC 3.23 (L) 3.80 - 5.20 MIL/uL   Hemoglobin 7.1 (L) 12.0 - 16.0 g/dL   HCT 41.6 (L) 38.4 - 53.6 %   MCV 69.7 (L) 80.0 - 100.0 fL   MCH 22.0 (L) 26.0 - 34.0 pg   MCHC 31.5 (L) 32.0 - 36.0 g/dL   RDW 46.8 (H) 03.2 - 12.2 %   Platelets 157 150 - 440 K/uL  Comprehensive metabolic panel     Status: Abnormal   Collection Time: 10/22/16  5:16 AM  Result Value Ref Range   Sodium 134 (L) 135 - 145 mmol/L   Potassium 4.0 3.5 - 5.1 mmol/L   Chloride 108 101 - 111 mmol/L   CO2 23 22 - 32 mmol/L   Glucose, Bld 87 65 - 99 mg/dL   BUN 16 6 - 20 mg/dL   Creatinine, Ser 4.82 0.44 - 1.00 mg/dL   Calcium 7.5 (L) 8.9 - 10.3 mg/dL   Total Protein 4.4 (L) 6.5 - 8.1 g/dL   Albumin 1.8 (L) 3.5 - 5.0 g/dL   AST 25 15 - 41 U/L   ALT 8 (L) 14 - 54 U/L   Alkaline Phosphatase 89 38 - 126 U/L   Total Bilirubin 1.0 0.3 - 1.2 mg/dL   GFR calc non Af Amer >60 >60 mL/min   GFR calc Af Amer >60 >60 mL/min   Anion gap 3 (L) 5 - 15     Assessment:   21 y.o. G1P0 postoperativeday # 1 from LTCS with preeclampsia   Plan:  1) Acute blood loss anemia - hemodynamically stable and asymptomatic - po  ferrous sulfate TID.  Will recheck CBC in the AM   2) Post op:  UOP picked up today, and now adequate.  S/p foley.  Continue to record I/O.  Ok to saline lock IV.  3) preeclampsia:  No s/sx worsening   4) dispo:  Continue inpatient admission  ----- Ranae Plumber, MD Attending Obstetrician and Gynecologist [facility], Department of OB/GYN [facility]

## 2016-10-22 NOTE — Anesthesia Post-op Follow-up Note (Signed)
  Anesthesia Pain Follow-up Note  Patient: Melissa Khan  Day #: 1  Date of Follow-up: 10/22/2016 Time: 7:19 AM  Last Vitals:  Vitals:   10/21/16 2347 10/22/16 0336  BP: 135/79 115/79  Pulse: 79 80  Resp: 20 18  Temp: 37.3 C 36.7 C  SpO2: 99% 98%    Level of Consciousness: alert  Pain: none   Side Effects:None  Catheter Site Exam:clean, dry     Plan: D/C from anesthesia care at surgeon's request  [facility]

## 2016-10-23 LAB — CBC
HCT: 22.7 % — ABNORMAL LOW (ref 35.0–47.0)
HEMOGLOBIN: 7.3 g/dL — AB (ref 12.0–16.0)
MCH: 22.6 pg — AB (ref 26.0–34.0)
MCHC: 32.1 g/dL (ref 32.0–36.0)
MCV: 70.5 fL — ABNORMAL LOW (ref 80.0–100.0)
Platelets: 166 10*3/uL (ref 150–440)
RBC: 3.23 MIL/uL — ABNORMAL LOW (ref 3.80–5.20)
RDW: 19.7 % — ABNORMAL HIGH (ref 11.5–14.5)
WBC: 10.5 10*3/uL (ref 3.6–11.0)

## 2016-10-23 MED ORDER — IBUPROFEN 600 MG PO TABS
600.0000 mg | ORAL_TABLET | Freq: Four times a day (QID) | ORAL | Status: DC
  Administered 2016-10-23 – 2016-10-24 (×5): 600 mg via ORAL
  Filled 2016-10-23 (×5): qty 1

## 2016-10-23 NOTE — Progress Notes (Signed)
Subjective: Postpartum Day 2: Cesarean Delivery Patient reports no problems voiding.    Objective: Vital signs in last 24 hours: Temp:  [97.8 F (36.6 C)-98.7 F (37.1 C)] 98.4 F (36.9 C) (08/18 1256) Pulse Rate:  [77-96] 77 (08/18 0900) Resp:  [18-20] 18 (08/18 0900) BP: (120-140)/(61-87) 125/75 (08/18 0933) SpO2:  [98 %-100 %] 99 % (08/18 0900)  Physical Exam:  General: alert Lochia: appropriate Uterine Fundus: firm Incision: healing well DVT Evaluation: No evidence of DVT seen on physical exam.   Recent Labs  10/22/16 0516 10/23/16 0622  HGB 7.1* 7.3*  HCT 22.5* 22.7*    Assessment/Plan: Status post Cesarean section. Doing well postoperatively.  Continue current care.  Ihor Austin Tadashi Burkel 10/23/2016, 1:29 PM

## 2016-10-23 NOTE — Plan of Care (Signed)
Problem: Pain Managment: Goal: General experience of comfort will improve Outcome: Progressing Achieving pain control with PRN Roxi and Motrin.  Problem: Physical Regulation: Goal: Will remain free from infection Outcome: Progressing C-section incision clean dry and intact.  Problem: Bowel/Gastric: Goal: Will not experience complications related to bowel motility Outcome: Progressing Pt receiving scheduled Senokot.

## 2016-10-23 NOTE — Lactation Note (Signed)
This note was copied from a baby's chart. Lactation Consultation Note  Patient Name: Melissa Khan Today's Date: 10/23/2016   Spoke with mom about pumping, she has not been pumping today, unsure if she will continue to pump, plans on setting up appt with [facility] in [place] On Monday.  She may pump this pm but unsure, overwhelmed with all that is going on she states, was not planning on delivering yet, encouraged to pump every 3 hrs if she plans on giving baby breastmilk once she tests negative for cannabis.  Informed that she could pump manually with the pump she has if needed. We will instruct her if needed before d/c if she desires.     Maternal Data    Feeding    LATCH Score                   Interventions    Lactation Tools Discussed/Used     Consult Status      Melissa Khan 10/23/2016, 5:56 PM

## 2016-10-24 ENCOUNTER — Encounter: Payer: Self-pay | Admitting: *Deleted

## 2016-10-24 LAB — CBC
HEMATOCRIT: 21.8 % — AB (ref 35.0–47.0)
HEMOGLOBIN: 6.9 g/dL — AB (ref 12.0–16.0)
MCH: 22.6 pg — AB (ref 26.0–34.0)
MCHC: 31.8 g/dL — ABNORMAL LOW (ref 32.0–36.0)
MCV: 71.2 fL — AB (ref 80.0–100.0)
Platelets: 178 10*3/uL (ref 150–440)
RBC: 3.06 MIL/uL — AB (ref 3.80–5.20)
RDW: 19.5 % — ABNORMAL HIGH (ref 11.5–14.5)
WBC: 9.1 10*3/uL (ref 3.6–11.0)

## 2016-10-24 MED ORDER — IBUPROFEN 800 MG PO TABS: 800.0000 mg | ORAL_TABLET | Freq: Three times a day (TID) | ORAL | 1 refills | Status: DC | PRN

## 2016-10-24 MED ORDER — OXYCODONE-ACETAMINOPHEN 5-325 MG PO TABS: 1.0000 | ORAL_TABLET | Freq: Four times a day (QID) | ORAL | 0 refills | Status: DC | PRN

## 2016-10-24 MED ORDER — FERROUS SULFATE 325 (65 FE) MG PO TABS: 325.0000 mg | ORAL_TABLET | Freq: Every day | ORAL | 1 refills | Status: AC

## 2016-10-24 MED ORDER — DOCUSATE SODIUM 100 MG PO CAPS: 100.0000 mg | ORAL_CAPSULE | Freq: Two times a day (BID) | ORAL | 0 refills | Status: AC

## 2016-10-24 NOTE — Progress Notes (Signed)
D/C instructions provided, pt states understanding, aware of follow up appt.  Prescription given to pt.   

## 2016-10-24 NOTE — Discharge Instructions (Signed)

## 2016-10-24 NOTE — Progress Notes (Signed)
Pt and family watching Period of Purple Cry.  

## 2016-10-24 NOTE — Progress Notes (Signed)
D/C home to car via auxiliary in wheelchair.  

## 2016-11-23 ENCOUNTER — Emergency Department
Admission: EM | Admit: 2016-11-23 | Discharge: 2016-11-23 | Disposition: A | Discharge status: Home / Self Care | Payer: 59 | Attending: Emergency Medicine | Admitting: Emergency Medicine

## 2016-11-23 ENCOUNTER — Encounter: Payer: Self-pay | Admitting: Emergency Medicine

## 2016-11-23 DIAGNOSIS — L02412 Cutaneous abscess of left axilla: Secondary | ICD-10-CM | POA: Insufficient documentation

## 2016-11-23 DIAGNOSIS — Z79899 Other long term (current) drug therapy: Secondary | ICD-10-CM | POA: Diagnosis not present

## 2016-11-23 DIAGNOSIS — R4689 Other symptoms and signs involving appearance and behavior: Secondary | ICD-10-CM | POA: Diagnosis not present

## 2016-11-23 DIAGNOSIS — F4325 Adjustment disorder with mixed disturbance of emotions and conduct: Secondary | ICD-10-CM | POA: Diagnosis not present

## 2016-11-23 DIAGNOSIS — Z87891 Personal history of nicotine dependence: Secondary | ICD-10-CM | POA: Diagnosis not present

## 2016-11-23 DIAGNOSIS — L0291 Cutaneous abscess, unspecified: Secondary | ICD-10-CM

## 2016-11-23 DIAGNOSIS — Z91040 Latex allergy status: Secondary | ICD-10-CM | POA: Diagnosis not present

## 2016-11-23 DIAGNOSIS — Z046 Encounter for general psychiatric examination, requested by authority: Secondary | ICD-10-CM | POA: Diagnosis present

## 2016-11-23 HISTORY — DX: Post-traumatic stress disorder, unspecified: F43.10

## 2016-11-23 HISTORY — DX: Bipolar disorder, unspecified: F31.9

## 2016-11-23 LAB — COMPREHENSIVE METABOLIC PANEL
ALT: 13 U/L — AB (ref 14–54)
AST: 20 U/L (ref 15–41)
Albumin: 4 g/dL (ref 3.5–5.0)
Alkaline Phosphatase: 67 U/L (ref 38–126)
Anion gap: 8 (ref 5–15)
BUN: 13 mg/dL (ref 6–20)
CHLORIDE: 105 mmol/L (ref 101–111)
CO2: 26 mmol/L (ref 22–32)
Calcium: 9.3 mg/dL (ref 8.9–10.3)
Creatinine, Ser: 0.72 mg/dL (ref 0.44–1.00)
GFR calc Af Amer: >60 mL/min (ref >60)
Glucose, Bld: 91 mg/dL (ref 65–99)
Potassium: 3.8 mmol/L (ref 3.5–5.1)
Sodium: 139 mmol/L (ref 135–145)
Total Bilirubin: 0.9 mg/dL (ref 0.3–1.2)
Total Protein: 7.7 g/dL (ref 6.5–8.1)

## 2016-11-23 LAB — ACETAMINOPHEN LEVEL: Acetaminophen (Tylenol), Serum: <10 ug/mL — ABNORMAL LOW (ref 10–30)

## 2016-11-23 LAB — SALICYLATE LEVEL: Salicylate Lvl: <7 mg/dL (ref 2.8–30.0)

## 2016-11-23 LAB — CBC
HCT: 30.3 % — ABNORMAL LOW (ref 35.0–47.0)
HEMOGLOBIN: 9.5 g/dL — AB (ref 12.0–16.0)
MCH: 22.2 pg — AB (ref 26.0–34.0)
MCHC: 31.4 g/dL — ABNORMAL LOW (ref 32.0–36.0)
MCV: 70.8 fL — AB (ref 80.0–100.0)
Platelets: 243 10*3/uL (ref 150–440)
RBC: 4.28 MIL/uL (ref 3.80–5.20)
RDW: 19.5 % — ABNORMAL HIGH (ref 11.5–14.5)
WBC: 9.4 10*3/uL (ref 3.6–11.0)

## 2016-11-23 LAB — ETHANOL

## 2016-11-23 MED ORDER — SULFAMETHOXAZOLE-TRIMETHOPRIM 800-160 MG PO TABS
1.0000 | ORAL_TABLET | Freq: Once | ORAL | Status: AC
  Administered 2016-11-23: 1 via ORAL
  Filled 2016-11-23: qty 1

## 2016-11-23 MED ORDER — LIDOCAINE-PRILOCAINE 2.5-2.5 % EX CREA
TOPICAL_CREAM | CUTANEOUS | Status: AC
  Filled 2016-11-23: qty 5

## 2016-11-23 MED ORDER — LIDOCAINE-PRILOCAINE 2.5-2.5 % EX CREA
TOPICAL_CREAM | Freq: Once | CUTANEOUS | Status: AC
  Administered 2016-11-23: 1 via TOPICAL
  Filled 2016-11-23: qty 5

## 2016-11-23 MED ORDER — LIDOCAINE HCL (PF) 1 % IJ SOLN
INTRAMUSCULAR | Status: AC
  Filled 2016-11-23: qty 5

## 2016-11-23 MED ORDER — IBUPROFEN 600 MG PO TABS: 600.0000 mg | ORAL_TABLET | Freq: Once | ORAL | Status: DC

## 2016-11-23 MED ORDER — SULFAMETHOXAZOLE-TRIMETHOPRIM 800-160 MG PO TABS: 1.0000 | ORAL_TABLET | Freq: Two times a day (BID) | ORAL | 0 refills | Status: AC

## 2016-11-23 NOTE — ED Triage Notes (Signed)
Pt says she does not want to hurt self, but just wants her abscess under left arm addressed.  She is coopertive, but reluctant to answer certain questions at times.

## 2016-11-23 NOTE — ED Notes (Addendum)

## 2016-11-23 NOTE — ED Notes (Signed)
Pt dressed out by this tech and Amy, rn; dr Don Perking stated to let pt keep piecing above lip due to pt stating couldn't get out herself.pt phone shorts tee shirt and shoes and hair band placed in belongings bag.  Pt tearful sitting on bed; rn and dr aware

## 2016-11-23 NOTE — Discharge Instructions (Signed)
You have been seen in the Emergency Department (ED) today for an abscess. A skin abscess is a bacterial infection that forms a pocket of pus. A boil is a kind of skin abscess. This was drained in the ED. If you were prescribed antibiotics, please make sure to take it fully as prescribed.  Follow-up with your doctor in 24-48 hours for a re-check. If you do not have a doctor you may return to the ER for a re-check.  How can you care for yourself at home?  Apply warm and dry compresses, a heating pad set on low, or a hot water bottle 3 or 4 times a day for pain. Keep a cloth between the heat source and your skin.  If your doctor prescribed antibiotics, take them as directed. Do not stop taking them just because you feel better. You need to take the full course of antibiotics.  Take pain medicines exactly as directed.  If the doctor gave you a prescription medicine for pain, take it as prescribed.  If you are not taking a prescription pain medicine, ask your doctor if you can take an over-the-counter medicine. Keep your bandage clean and dry. Change the bandage whenever it gets wet or dirty, or at least one time a day.  If the abscess was packed with gauze:  Keep follow-up appointments to have the gauze changed or removed. If the doctor instructed you to remove the gauze, gently pull out all of the gauze when your doctor tells you to.  After the gauze is removed, soak the area in warm water for 15 to 20 minutes 2 times a day, until the wound closes.  When should you call for help?  Call your doctor now or seek immediate medical care if:  You have signs of worsening infection, such as:  Increased pain, swelling, warmth, or redness.  Red streaks leading from the infected skin.  Pus draining from the wound.  A fever. Uncontrolled nausea and vomiting Watch closely for changes in your health, and be sure to contact your doctor if:  You do not get better as expected.   When should you call for help?   Call your doctor now or seek immediate medical care if:  You have signs of worsening infection, such as:  Increased pain, swelling, warmth, or redness.  Red streaks leading from the infected skin.  Pus draining from the wound.  A fever. Uncontrolled nausea and vomiting Watch closely for changes in your health, and be sure to contact your doctor if:  You do not get better as expected  

## 2016-11-23 NOTE — ED Triage Notes (Signed)
FIRST NURSE NOTE-pt brought by [place] pd voluntary to talk with someone. 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  Pt has new born baby and significant other with her.

## 2016-11-23 NOTE — Consult Note (Signed)
[facility] Consult   Reason for Consult:  Consult for 21 year old woman who came to the emergency room initially for treatment of the skin infection but admitted to having some suicidal thoughts earlier today Referring Physician:  Alfred Levins Patient Identification: Melissa Khan MRN:  [id number] Principal Diagnosis: Adjustment disorder with mixed disturbance of emotions and conduct Diagnosis:   Patient Active Problem List   Diagnosis Date Noted  . Adjustment disorder with mixed disturbance of emotions and conduct [F43.25] 11/23/2016  . Preeclampsia [O14.90] 10/20/2016  . Severe recurrent major depressive disorder with psychotic features ([place]) [F33.3]   . Severe episode of recurrent major depressive disorder, without psychotic features ([place]) [F33.2] 09/08/2015  . Sedative, hypnotic or anxiolytic use disorder, mild, abuse [F13.10] 09/08/2015  . Cannabis use disorder, moderate, dependence ([place]) [F12.20] 09/08/2015  . Suicidal ideation [R45.851] 09/08/2015  . Self-inflicted laceration of wrist [S61.519A] 09/08/2015    Total Time spent with patient: 1 hour  Subjective:   Melissa Khan is a 21 y.o. female patient admitted with "I was just yelling at my grandmother because she wasn't listening to me".  HPI:  Patient interviewed chart reviewed. 21 year old woman came to the emergency room for treatment of an abscess in her underarm but admitted that earlier today she had made some suicidal statements. Patient says that she has been under great deal of stress recently. She gave birth to a child just about a month ago. She is still recovering from the C-section. She and her boyfriend and their 8-monthold child have been staying at the home of the patient's grandparents. Today apparently a big argument erupted over something or other which turned into several hours of people yelling at each other until the police and actually came by. Patient admits that she made a statement about  how she will wish she would just kill herself. She did not actually act on it or do anything to harm herself. Patient has adamantly denied since being in the emergency room having any actual thought to harm herself. Denies suicidal ideation. Says that her mood is been stressed out and she doesn't sleep well but no worse than usual. She says she feels like she is doing a good job bonding with the baby. Doesn't feel consistently depressed. Doesn't have any psychotic symptoms. Denies that she's been abusing cannabis or any other drugs since getting pregnant. Patient is currently calm and appears lucid. Not getting any active psychiatric treatment.  Social history: Patient and her boyfriend now have a 121-monthld child. The 2 of them had been staying with her grandparents for about 5 days but today [place] the [place] thinks maybe she[place] work with CPS is evidently involved for some reason looking after the child as well.  Medical history: Patient had an abscess under her arm today which the emergency room doctor addressed. She has a past history of cutting but has not been hurting herself recently.  Substance abuse history: History of heavy cannabis abuse but she claims she has not been using since getting pregnant.  Past Psychiatric History: Patient has one previous psychiatric admission to our hospital 1 year ago this summer. At that time she had been cutting herself and was displaying some chaotic mood and behavioral symptoms. Earlier than that she had had a diagnosis of ADHD. There was question of possible psychotic symptoms when she was here last  year. Patient is of the opinion that she just has PTSD. She denies consistent psychotic symptoms. Says that other than the overdose last year she has no other history of suicide attempts.  Risk to Self: Is patient at risk for  suicide?: No Risk to Others:   Prior Inpatient Therapy:   Prior Outpatient Therapy:    Past Medical History:  Past Medical History:  Diagnosis Date  . ADD (attention deficit disorder)   . Anxiety   . Bipolar 1 disorder ([place])   . Depression   . PTSD (post-traumatic stress disorder)     Past Surgical History:  Procedure Laterality Date  . CESAREAN SECTION N/A 10/21/2016   Procedure: CESAREAN SECTION;  Surgeon: Benjaman Kindler, MD;  Location: [facility] ORS;  Service: Obstetrics;  Laterality: N/A;  Female born @ 1349 Apgars: 9/9 Weight: 5lbs 12 ozs  . EXTERNAL EAR SURGERY    . WISDOM TOOTH EXTRACTION     Family History:  Family History  Problem Relation Age of Onset  . Breast cancer Paternal Aunt    Family Psychiatric  History: Positive family history of anxiety Social History:  History  Alcohol Use No    Comment: ONCE A YEAR     History  Drug Use No    Comment: quit two weeks ago    Social History   Social History  . Marital status: Single    Spouse name: N/A  . Number of children: N/A  . Years of education: N/A   Social History Main Topics  . Smoking status: Former Smoker    Quit date: 07/24/2014  . Smokeless tobacco: Never Used  . Alcohol use No     Comment: ONCE A YEAR  . Drug use: No     Comment: quit two weeks ago  . Sexual activity: Yes    Birth control/ protection: None     Comment: DECLINED INSURANCE QUESTIONS   Other Topics Concern  . None   Social History Narrative  . None   Additional Social History:    Allergies:   Allergies  Allergen Reactions  . Shrimp [Shellfish Allergy] Hives  . Tramadol Hives  . Latex Itching and Rash    Labs:  Results for orders placed or performed during the hospital encounter of 11/23/16 (from the past 48 hour(s))  Comprehensive metabolic panel     Status: Abnormal   Collection Time: 11/23/16  4:25 PM  Result Value Ref Range   Sodium 139 135 - 145 mmol/L   Potassium 3.8 3.5 - 5.1 mmol/L   Chloride 105 101 -  111 mmol/L   CO2 26 22 - 32 mmol/L   Glucose, Bld 91 65 - 99 mg/dL   BUN 13 6 - 20 mg/dL   Creatinine, Ser 0.72 0.44 - 1.00 mg/dL   Calcium 9.3 8.9 - 10.3 mg/dL   Total Protein 7.7 6.5 - 8.1 g/dL   Albumin 4.0 3.5 - 5.0 g/dL   AST 20 15 - 41 U/L   ALT 13 (L) 14 - 54 U/L   Alkaline Phosphatase 67 38 - 126 U/L   Total Bilirubin 0.9 0.3 - 1.2 mg/dL   GFR calc non Af Amer >60 >60 mL/min   GFR calc Af Amer >60 >60 mL/min    Comment: (NOTE) The eGFR has been calculated using the CKD EPI equation. This calculation has not been validated in all clinical situations. eGFR's persistently <60 mL/min signify possible Chronic Kidney Disease.    Anion gap 8 5 - 15  Ethanol     Status: None   Collection Time: 11/23/16  4:25 PM  Result Value Ref Range   Alcohol, Ethyl (B) <5 <5 mg/dL    Comment:        LOWEST DETECTABLE LIMIT FOR SERUM ALCOHOL IS 5 mg/dL FOR MEDICAL PURPOSES ONLY   Salicylate level     Status: None   Collection Time: 11/23/16  4:25 PM  Result Value Ref Range   Salicylate Lvl <6.1 2.8 - 30.0 mg/dL  Acetaminophen level     Status: Abnormal   Collection Time: 11/23/16  4:25 PM  Result Value Ref Range   Acetaminophen (Tylenol), Serum <10 (L) 10 - 30 ug/mL    Comment:        THERAPEUTIC CONCENTRATIONS VARY SIGNIFICANTLY. A RANGE OF 10-30 ug/mL MAY BE AN EFFECTIVE CONCENTRATION FOR MANY PATIENTS. HOWEVER, SOME ARE BEST TREATED AT CONCENTRATIONS OUTSIDE THIS RANGE. ACETAMINOPHEN CONCENTRATIONS >150 ug/mL AT 4 HOURS AFTER INGESTION AND >50 ug/mL AT 12 HOURS AFTER INGESTION ARE OFTEN ASSOCIATED WITH TOXIC REACTIONS.   cbc     Status: Abnormal   Collection Time: 11/23/16  4:25 PM  Result Value Ref Range   WBC 9.4 3.6 - 11.0 K/uL   RBC 4.28 3.80 - 5.20 MIL/uL   Hemoglobin 9.5 (L) 12.0 - 16.0 g/dL   HCT 30.3 (L) 35.0 - 47.0 %   MCV 70.8 (L) 80.0 - 100.0 fL   MCH 22.2 (L) 26.0 - 34.0 pg   MCHC 31.4 (L) 32.0 - 36.0 g/dL   RDW 19.5 (H) 11.5 - 14.5 %   Platelets 243  150 - 440 K/uL    Current Facility-Administered Medications  Medication Dose Route Frequency Provider Last Rate Last Dose  . ibuprofen (ADVIL,MOTRIN) tablet 600 mg  600 mg Oral Once Byron, [place], MD      . lidocaine (PF) (XYLOCAINE) 1 % injection           . lidocaine-prilocaine (EMLA) 2.5-2.5 % cream           . sulfamethoxazole-trimethoprim (BACTRIM DS,SEPTRA DS) 800-160 MG per tablet 1 tablet  1 tablet Oral Once Rudene Re, MD       Current Outpatient Prescriptions  Medication Sig Dispense Refill  . ferrous sulfate 325 (65 FE) MG tablet Take 1 tablet (325 mg total) by mouth daily with breakfast. Take with Vitamin C 30 tablet 1  . ibuprofen (ADVIL,MOTRIN) 800 MG tablet Take 1 tablet (800 mg total) by mouth every 8 (eight) hours as needed for moderate pain or cramping. 30 tablet 1  . oxyCODONE-acetaminophen (PERCOCET/ROXICET) 5-325 MG tablet Take 1-2 tablets by mouth every 6 (six) hours as needed for severe pain. 10 tablet 0  . sulfamethoxazole-trimethoprim (BACTRIM DS,SEPTRA DS) 800-160 MG tablet Take 1 tablet by mouth 2 (two) times daily. 14 tablet 0    Musculoskeletal: Strength & Muscle Tone: within normal limits Gait & Station: normal Patient leans: N/A  Psychiatric Specialty Exam: Physical Exam  Nursing note and vitals reviewed. Constitutional: She appears well-developed and well-nourished.  HENT:  Head: Normocephalic and atraumatic.  Eyes: Pupils are equal, round, and reactive to light. Conjunctivae are normal.  Neck: Normal range of motion.  Cardiovascular: Regular rhythm and normal heart sounds.   Respiratory: Effort normal. No respiratory distress.  GI: Soft.  Musculoskeletal: Normal range of motion.  Neurological: She is alert.  Skin: Skin is warm and dry.  Psychiatric: Her speech is normal and behavior is normal. Her mood appears anxious. Thought content is not paranoid.  Cognition and memory are normal. She expresses impulsivity. She expresses no  homicidal and no suicidal ideation.    Review of Systems  Constitutional: Negative.   HENT: Negative.   Eyes: Negative.   Respiratory: Negative.   Cardiovascular: Negative.   Gastrointestinal: Negative.   Musculoskeletal: Negative.   Skin: Negative.        Patient has an abscess under her left axilla which was treated by the emergency room physician  Neurological: Negative.   Psychiatric/Behavioral: Negative for depression, hallucinations, memory loss, substance abuse and suicidal ideas. The patient is not nervous/anxious and does not have insomnia.     Blood pressure 122/64, pulse (!) 127, temperature 98.8 F (37.1 C), temperature source Oral, height 5' 2"  (1.575 m), weight 76 kg (167 lb 8.8 oz), SpO2 100 %, unknown if currently breastfeeding.Body mass index is 30.65 kg/m.  General Appearance: Casual  Eye Contact:  Good  Speech:  Clear and Coherent  Volume:  Normal  Mood:  Euthymic  Affect:  Appropriate  Thought Process:  Goal Directed  Orientation:  Full (Time, Place, and Person)  Thought Content:  Logical  Suicidal Thoughts:  No  Homicidal Thoughts:  No  Memory:  Immediate;   Good Recent;   Good Remote;   Fair  Judgement:  Fair  Insight:  Fair  Psychomotor Activity:  Normal  Concentration:  Concentration: Fair  Recall:  [employer] of Knowledge:  Fair  Language:  Fair  Akathisia:  Negative  Handed:  Right  AIMS (if indicated):     Assets:  Desire for Improvement Physical Health Resilience Social Support  ADL's:  Intact  Cognition:  WNL  Sleep:        Treatment Plan Summary: Plan 21 year old woman with a history of mood instability apparently was in a loud argument with her family today. She admits that she made a statement about being suicidal but did not act on it and has consistently denied suicidality since. Doesn't appear to have an acute syndrome of depression or psychosis. She convincingly relates multiple positive things in her life. I do not think she  meets commitment criteria nor does she require inpatient psychiatric treatment. Patient was counseled about the availability of therapy in the counties that she may choose to live in. Discontinue IVC. Patient can be discharged from the emergency room at the discretion of the ER doctor.  Disposition: No evidence of imminent risk to self or others at present.   Patient does not meet criteria for psychiatric inpatient admission.  Alethia Berthold, MD 11/23/2016 7:42 PM

## 2016-11-23 NOTE — ED Provider Notes (Signed)
[facility] Emergency Department Provider Note  ____________________________________________  Time seen: Approximately 5:23 PM  I have reviewed the triage vital signs and the nursing notes.   HISTORY  Chief Complaint Psychiatric Evaluation   HPI Melissa Khan is a 21 y.o. female with a history of bipolar, PTSD, suicidal ideation who presents brought in by police for psychiatric evaluation. Patient reports that she had a baby a month ago. She has been living with her boyfriend, baby, and grandparents. Her boyfriend is moving back to [place] and wishes that the patient and the baby come with him. Patient's grandparents want to keep the baby. There has been a lot of stress associated with that and today patient was arguing with her grandparents. Her boyfriend called the police because he was afraid of physical altercation between them. When the police arrived patient was talking about being suicidal and having a history of suicidality which prompted her visit to the emergency room. Patient tells me that she was just very upset and she is not planning on killing herself. She denies suicidal thoughts or homicidal thoughts. She denies feeling depressed since the birth of the baby however she does endorse that she hasn't been able to sleep since the baby was born and that is very stressful.  patient is also complaining of an abscess located in her left arm pain which has been present for the last 3 days. She denies IV drug use. She is complaining of mild to moderate constant throbbing pain. No fever or chills. No nausea or vomiting.  Past Medical History:  Diagnosis Date  . ADD (attention deficit disorder)   . Anxiety   . Bipolar 1 disorder (HCC)   . Depression   . PTSD (post-traumatic stress disorder)     Patient Active Problem List   Diagnosis Date Noted  . Preeclampsia 10/20/2016  . Severe recurrent major depressive disorder with psychotic features (HCC)     . Severe episode of recurrent major depressive disorder, without psychotic features (HCC) 09/08/2015  . Sedative, hypnotic or anxiolytic use disorder, mild, abuse 09/08/2015  . Cannabis use disorder, moderate, dependence (HCC) 09/08/2015  . Suicidal ideation 09/08/2015  . Self-inflicted laceration of wrist 09/08/2015    Past Surgical History:  Procedure Laterality Date  . CESAREAN SECTION N/A 10/21/2016   Procedure: CESAREAN SECTION;  Surgeon: Christeen Douglas, MD;  Location: [facility] ORS;  Service: Obstetrics;  Laterality: N/A;  Female born @ 1349 Apgars: 9/9 Weight: 5lbs 12 ozs  . EXTERNAL EAR SURGERY    . WISDOM TOOTH EXTRACTION      Prior to Admission medications   Medication Sig Start Date End Date Taking? Authorizing Provider  ferrous sulfate 325 (65 FE) MG tablet Take 1 tablet (325 mg total) by mouth daily with breakfast. Take with Vitamin C 10/24/16 12/23/16  Christeen Douglas, MD  ibuprofen (ADVIL,MOTRIN) 800 MG tablet Take 1 tablet (800 mg total) by mouth every 8 (eight) hours as needed for moderate pain or cramping. 10/24/16   Christeen Douglas, MD  oxyCODONE-acetaminophen (PERCOCET/ROXICET) 5-325 MG tablet Take 1-2 tablets by mouth every 6 (six) hours as needed for severe pain. 10/24/16   Christeen Douglas, MD  sulfamethoxazole-trimethoprim (BACTRIM DS,SEPTRA DS) 800-160 MG tablet Take 1 tablet by mouth 2 (two) times daily. 11/23/16 11/30/16  Nita Sickle, MD    Allergies Shrimp [shellfish allergy]; Tramadol; and Latex  Family History  Problem Relation Age of Onset  . Breast cancer Paternal Aunt     Social History Social History  Substance Use Topics  . Smoking status: Former Smoker    Quit date: 07/24/2014  . Smokeless tobacco: Never Used  . Alcohol use No     Comment: ONCE A YEAR    Review of Systems  Constitutional: Negative for fever. Eyes: Negative for visual changes. ENT: Negative for sore throat. Neck: No neck pain  Cardiovascular: Negative for chest  pain. Respiratory: Negative for shortness of breath. Gastrointestinal: Negative for abdominal pain, vomiting or diarrhea. Genitourinary: Negative for dysuria. Musculoskeletal: Negative for back pain. Skin: Negative for rash. Neurological: Negative for headaches, weakness or numbness. Psych: No SI or HI  ____________________________________________   PHYSICAL EXAM:  VITAL SIGNS: ED Triage Vitals  Enc Vitals Group     BP 11/23/16 1609 122/64     Pulse Rate 11/23/16 1609 (!) 127     Resp --      Temp 11/23/16 1609 98.8 F (37.1 C)     Temp Source 11/23/16 1609 Oral     SpO2 11/23/16 1609 100 %     Weight 11/23/16 1610 167 lb 8.8 oz (76 kg)     Height 11/23/16 1610  (1.575 m)     Head Circumference --      Peak Flow --      Pain Score 11/23/16 1617 10     Pain Loc --      Pain Edu? --      Excl. in GC? --     Constitutional: Alert and oriented. Well appearing and in no apparent distress. HEENT:      Head: Normocephalic and atraumatic.         Eyes: Conjunctivae are normal. Sclera is non-icteric.       Mouth/Throat: Mucous membranes are moist.       Neck: Supple with no signs of meningismus. Cardiovascular: Regular rate and rhythm. No murmurs, gallops, or rubs. 2+ symmetrical distal pulses are present in all extremities. No JVD. Respiratory: Normal respiratory effort. Lungs are clear to auscultation bilaterally. No wheezes, crackles, or rhonchi.  Gastrointestinal: Soft, non tender, and non distended with positive bowel sounds. No rebound or guarding. Musculoskeletal: Nontender with normal range of motion in all extremities. No edema, cyanosis, or erythema of extremities. Neurologic: Normal speech and language. Face is symmetric. Moving all extremities. No gross focal neurologic deficits are appreciated. Skin: Skin is warm, dry and intact. No rash noted. There is an abscess located in the L axillary region Psychiatric: Mood and affect are normal. Speech and behavior are  normal.  ____________________________________________   LABS (all labs ordered are listed, but only abnormal results are displayed)  Labs Reviewed  COMPREHENSIVE METABOLIC PANEL - Abnormal; Notable for the following:       Result Value   ALT 13 (*)    All other components within normal limits  ACETAMINOPHEN LEVEL - Abnormal; Notable for the following:    Acetaminophen (Tylenol), Serum <10 (*)    All other components within normal limits  CBC - Abnormal; Notable for the following:    Hemoglobin 9.5 (*)    HCT 30.3 (*)    MCV 70.8 (*)    MCH 22.2 (*)    MCHC 31.4 (*)    RDW 19.5 (*)    All other components within normal limits  ETHANOL  SALICYLATE LEVEL  URINE DRUG SCREEN, QUALITATIVE ([facility] ONLY)   ____________________________________________  EKG  none  ____________________________________________  RADIOLOGY  none  ____________________________________________   PROCEDURES  Procedure(s) performed:yes .Marland KitchenIncision and Drainage Date/Time: 11/23/2016 6:39  PM Performed by: Nita Sickle Authorized by: Nita Sickle   Consent:    Consent obtained:  Verbal   Consent given by:  Patient   Risks discussed:  Bleeding, incomplete drainage, pain and infection Location:    Type:  Abscess   Size:  3   Location:  Upper extremity   Upper extremity location: axilla. Pre-procedure details:    Skin preparation:  Betadine Anesthesia (see MAR for exact dosages):    Anesthesia method:  Local infiltration   Local anesthetic:  Lidocaine 1% w/o epi Procedure type:    Complexity:  Simple Procedure details:    Incision types:  Stab incision   Scalpel blade:  11   Wound management:  Probed and deloculated and extensive cleaning   Drainage:  Purulent   Drainage amount:  Moderate   Wound treatment:  Wound left open   Packing materials:  None Post-procedure details:    Patient tolerance of procedure:  Tolerated well, no immediate complications   Critical Care  performed:  None ____________________________________________   INITIAL IMPRESSION / ASSESSMENT AND PLAN / ED COURSE   21 y.o. female with a history of bipolar, PTSD, suicidal ideation who presents brought in by police for psychiatric evaluationafter telling police that she was suicidal. Patient denies any suicidality at this time. Her only complaint is an abscess located in her left armpit. No signs or symptoms of systemic infection. Will I&D the abscess. Basic labs done for medical clearance. Will consult psychiatry for evaluation.    _________________________ 7:25 PM on 11/23/2016 -----------------------------------------  labs and no acute finding. Abscess drained per procedure note above. Patient can be discharged home on Bactrim. Patient was evaluated by Dr. Toni Amend and clear for discharge.  Pertinent labs & imaging results that were available during my care of the patient were reviewed by me and considered in my medical decision making (see chart for details).    ____________________________________________   FINAL CLINICAL IMPRESSION(S) / ED DIAGNOSES  Final diagnoses:  Aggressive behavior  Abscess      NEW MEDICATIONS STARTED DURING THIS VISIT:  New Prescriptions   SULFAMETHOXAZOLE-TRIMETHOPRIM (BACTRIM DS,SEPTRA DS) 800-160 MG TABLET    Take 1 tablet by mouth 2 (two) times daily.     Note:  This document was prepared using Dragon voice recognition software and may include unintentional dictation errors.    Nita Sickle, MD 11/23/16 [phone]

## 2016-11-23 NOTE — ED Notes (Signed)
BEHAVIORAL HEALTH ROUNDING Patient sleeping: No. Patient alert and oriented: yes Behavior appropriate: Yes.  ; If no, describe:  Nutrition and fluids offered: yes Toileting and hygiene offered: Yes  Sitter present: q15 minute observations and security  monitoring Law enforcement present: Yes  ODS  

## 2016-12-18 ENCOUNTER — Emergency Department
Admission: EM | Admit: 2016-12-18 | Discharge: 2016-12-19 | Disposition: A | Discharge status: Home / Self Care | Payer: Medicaid Other | Attending: Emergency Medicine | Admitting: Emergency Medicine

## 2016-12-18 ENCOUNTER — Encounter: Payer: Self-pay | Admitting: Emergency Medicine

## 2016-12-18 DIAGNOSIS — F329 Major depressive disorder, single episode, unspecified: Secondary | ICD-10-CM | POA: Insufficient documentation

## 2016-12-18 DIAGNOSIS — Z87891 Personal history of nicotine dependence: Secondary | ICD-10-CM | POA: Insufficient documentation

## 2016-12-18 DIAGNOSIS — F32A Depression, unspecified: Secondary | ICD-10-CM

## 2016-12-18 DIAGNOSIS — Z79899 Other long term (current) drug therapy: Secondary | ICD-10-CM | POA: Diagnosis not present

## 2016-12-18 DIAGNOSIS — Z91040 Latex allergy status: Secondary | ICD-10-CM | POA: Insufficient documentation

## 2016-12-18 LAB — ACETAMINOPHEN LEVEL: Acetaminophen (Tylenol), Serum: <10 ug/mL — ABNORMAL LOW (ref 10–30)

## 2016-12-18 LAB — COMPREHENSIVE METABOLIC PANEL
ALK PHOS: 52 U/L (ref 38–126)
ALT: 15 U/L (ref 14–54)
AST: 22 U/L (ref 15–41)
Albumin: 4.3 g/dL (ref 3.5–5.0)
Anion gap: 10 (ref 5–15)
BUN: 16 mg/dL (ref 6–20)
CHLORIDE: 104 mmol/L (ref 101–111)
CO2: 24 mmol/L (ref 22–32)
CREATININE: 0.72 mg/dL (ref 0.44–1.00)
Calcium: 9.3 mg/dL (ref 8.9–10.3)
GFR calc Af Amer: >60 mL/min (ref >60)
GLUCOSE: 90 mg/dL (ref 65–99)
POTASSIUM: 4.3 mmol/L (ref 3.5–5.1)
Sodium: 138 mmol/L (ref 135–145)
Total Bilirubin: 1.3 mg/dL — ABNORMAL HIGH (ref 0.3–1.2)
Total Protein: 7.9 g/dL (ref 6.5–8.1)

## 2016-12-18 LAB — CBC
HEMATOCRIT: 31.9 % — AB (ref 35.0–47.0)
HEMOGLOBIN: 10 g/dL — AB (ref 12.0–16.0)
MCH: 21.9 pg — ABNORMAL LOW (ref 26.0–34.0)
MCHC: 31.4 g/dL — ABNORMAL LOW (ref 32.0–36.0)
MCV: 69.9 fL — AB (ref 80.0–100.0)
Platelets: 263 10*3/uL (ref 150–440)
RBC: 4.55 MIL/uL (ref 3.80–5.20)
RDW: 19.3 % — ABNORMAL HIGH (ref 11.5–14.5)
WBC: 6.7 10*3/uL (ref 3.6–11.0)

## 2016-12-18 LAB — SALICYLATE LEVEL: Salicylate Lvl: <7 mg/dL (ref 2.8–30.0)

## 2016-12-18 LAB — URINE DRUG SCREEN, QUALITATIVE (ARMC ONLY)
AMPHETAMINES, UR SCREEN: NOT DETECTED
Barbiturates, Ur Screen: NOT DETECTED
Benzodiazepine, Ur Scrn: NOT DETECTED
COCAINE METABOLITE, UR ~~LOC~~: NOT DETECTED
Cannabinoid 50 Ng, Ur ~~LOC~~: POSITIVE — AB
MDMA (ECSTASY) UR SCREEN: NOT DETECTED
METHADONE SCREEN, URINE: NOT DETECTED
OPIATE, UR SCREEN: NOT DETECTED
PHENCYCLIDINE (PCP) UR S: NOT DETECTED
Tricyclic, Ur Screen: POSITIVE — AB

## 2016-12-18 LAB — ETHANOL: Alcohol, Ethyl (B): <10 mg/dL (ref <10)

## 2016-12-18 LAB — POCT PREGNANCY, URINE: Preg Test, Ur: NEGATIVE

## 2016-12-18 NOTE — ED Triage Notes (Signed)
Pt brought to ED by [facility]. Per PD they were called out to pts home during the call pt became agitated and started threatening to punch PD and threatening to harm herself. Pt admits that she did take a double dose of her Flexeril last night and states that it just made her sleepy. Pt states that when she gets into her "fits" that she says that she is going to harm herself but that she isn't serious about it. Pt is irritable in triage.

## 2016-12-18 NOTE — ED Notes (Signed)
Pt changed into scrubs and belongings secured. Pt states lip ring has been stuck for a year. Pt unable to give a urine sample at this time.

## 2016-12-18 NOTE — ED Notes (Signed)

## 2016-12-18 NOTE — ED Notes (Signed)
Patient anxious. Denies SI/HI.  States her OB/GYN had given her a referral for a psychiatrist, which she has a scheduled appointment for, but could not recall the date of the appointment.  States she used to have a therapy dog to help her with her emotional swings, but the dog died in 04/18/2022 and states she just wants another therapy dog.  Speech is rapid and thoughts are tangential with loose associations.

## 2016-12-18 NOTE — ED Notes (Signed)
Report given to [facility] MD. Camera placed in room and explained to pt the process.

## 2016-12-18 NOTE — ED Notes (Signed)
BEHAVIORAL HEALTH ROUNDING  Patient sleeping: No.  Patient alert and oriented: yes  Behavior appropriate: Yes. ; If no, describe:  Nutrition and fluids offered: Yes  Toileting and hygiene offered: Yes  Sitter present: not applicable, Q 15 min safety rounds and observation.  Law enforcement present: Yes ODS  

## 2016-12-18 NOTE — Discharge Instructions (Signed)
Please seek medical attention and help for any thoughts about wanting to harm yourself, harm others, any concerning change in behavior, severe depression, inappropriate drug use or any other new or concerning symptoms. ° °

## 2016-12-18 NOTE — ED Provider Notes (Signed)
[facility] Emergency Department Provider Note  ____________________________________________   I have reviewed the triage vital signs and the nursing notes.   HISTORY  Chief Complaint IVC   History limited by: Not Limited   HPI Melissa Khan is a 21 y.o. female who presents to the emergency department today under police escort because of concerns for suicidal ideation and depression.  DURATION:weeks TIMING: worse today QUALITY: threatened to harm herself CONTEXT: states she has been having problems with depression since giving birth roughly 2 months ago. States she was put on antidepressants by her ob/gyn.  MODIFYING FACTORS: worsened when she was confronted by the police   Per medical record review patient has a history of bipolar disorder, depression.  Past Medical History:  Diagnosis Date  . ADD (attention deficit disorder)   . Anxiety   . Bipolar 1 disorder (HCC)   . Depression   . PTSD (post-traumatic stress disorder)     Patient Active Problem List   Diagnosis Date Noted  . Adjustment disorder with mixed disturbance of emotions and conduct 11/23/2016  . Preeclampsia 10/20/2016  . Severe recurrent major depressive disorder with psychotic features (HCC)   . Severe episode of recurrent major depressive disorder, without psychotic features (HCC) 09/08/2015  . Sedative, hypnotic or anxiolytic use disorder, mild, abuse (HCC) 09/08/2015  . Cannabis use disorder, moderate, dependence (HCC) 09/08/2015  . Suicidal ideation 09/08/2015  . Self-inflicted laceration of wrist 09/08/2015    Past Surgical History:  Procedure Laterality Date  . CESAREAN SECTION N/A 10/21/2016   Procedure: CESAREAN SECTION;  Surgeon: Christeen Douglas, MD;  Location: [facility] ORS;  Service: Obstetrics;  Laterality: N/A;  Female born @ 1349 Apgars: 9/9 Weight: 5lbs 12 ozs  . EXTERNAL EAR SURGERY    . WISDOM TOOTH EXTRACTION      Prior to Admission medications    Medication Sig Start Date End Date Taking? Authorizing Provider  cyclobenzaprine (FLEXERIL) 10 MG tablet Take 0.5-1 tablets by mouth every 8 (eight) hours as needed. 12/15/16  Yes [provider]  norgestimate-ethinyl estradiol (ORTHO-CYCLEN,SPRINTEC,PREVIFEM) 0.25-35 MG-MCG tablet Take 1 tablet by mouth daily. 12/15/16  Yes [provider]  sertraline (ZOLOFT) 50 MG tablet Take 50 mg by mouth daily. 12/15/16 12/15/17 Yes [provider]  ferrous sulfate 325 (65 FE) MG tablet Take 1 tablet (325 mg total) by mouth daily with breakfast. Take with Vitamin C 10/24/16 12/23/16  Christeen Douglas, MD    Allergies Hydrocodone; Propofol; Shrimp [shellfish allergy]; Tramadol; and Latex  Family History  Problem Relation Age of Onset  . Breast cancer Paternal Aunt     Social History Social History  Substance Use Topics  . Smoking status: Former Smoker    Quit date: 07/24/2014  . Smokeless tobacco: Never Used  . Alcohol use No     Comment: ONCE A YEAR    Review of Systems Constitutional: No fever/chills Eyes: No visual changes. ENT: No sore throat. Cardiovascular: Denies chest pain. Respiratory: Denies shortness of breath. Gastrointestinal: Positive for lower abdominal pain around the site of csection scar. Genitourinary: Negative for dysuria. Musculoskeletal: Negative for back pain. Skin: Negative for rash. Neurological: Negative for headaches, focal weakness or numbness.  ____________________________________________   PHYSICAL EXAM:  VITAL SIGNS: ED Triage Vitals  Enc Vitals Group     BP 12/18/16 1810 (!) 126/98     Pulse Rate 12/18/16 1810 (!) 129     Resp 12/18/16 1810 16     Temp 12/18/16 1810 99 F (37.2  C)     Temp Source 12/18/16 1810 Oral     SpO2 12/18/16 1810 99 %     Weight --      Height --      Head Circumference --      Peak Flow --      Pain Score 12/18/16 1802 10     Pain Loc --     Constitutional: Alert and oriented. Well  appearing and in no distress. Eyes: Conjunctivae are normal.  ENT   Head: Normocephalic and atraumatic.   Nose: No congestion/rhinnorhea.   Mouth/Throat: Mucous membranes are moist.   Neck: No stridor. Hematological/Lymphatic/Immunilogical: No cervical lymphadenopathy. Cardiovascular: Normal rate, regular rhythm.  No murmurs, rubs, or gallops.  Respiratory: Normal respiratory effort without tachypnea nor retractions. Breath sounds are clear and equal bilaterally. No wheezes/rales/rhonchi. Gastrointestinal: Soft and non tender. Cesarean section scar without erythema, dehiscence or fluctuance. No rebound. No guarding.  Genitourinary: Deferred Musculoskeletal: Normal range of motion in all extremities. No lower extremity edema. Neurologic:  Normal speech and language. No gross focal neurologic deficits are appreciated.  Skin:  Skin is warm, dry and intact. No rash noted. Psychiatric: Depressed  ____________________________________________    LABS (pertinent positives/negatives)  POCT neg UDS pos tricycliccannaboid CBC hgb 10 cmp wnl Ethanol <10 ____________________________________________   EKG  I, Phineas Semen, attending physician, personally viewed and interpreted this EKG  EKG Time: 1825 Rate: 108 Rhythm: sinus tachycardia Axis: normal Intervals: qtc 436 QRS: narrow ST changes: no st elevation Impression: normal ekg   ____________________________________________    RADIOLOGY  None  ____________________________________________   PROCEDURES  Procedures  ____________________________________________   INITIAL IMPRESSION / ASSESSMENT AND PLAN / ED COURSE  Pertinent labs & imaging results that were available during my care of the patient were reviewed by me and considered in my medical decision making (see chart for details).  Patient presented to the emergency department today under IVC because of concerns for depression and thoughts of  self-harm. Patient did seem somewhat depressed when I talked her states she did mention she would want hurt herself. I did place patient under IVC. She was evaluated by psychiatry who felt she was safe for discharge.  ____________________________________________   FINAL CLINICAL IMPRESSION(S) / ED DIAGNOSES  Final diagnoses:  Depression, unspecified depression type     Note: This dictation was prepared with Dragon dictation. Any transcriptional errors that result from this process are unintentional     Phineas Semen, MD 12/19/16 0000

## 2017-04-10 ENCOUNTER — Other Ambulatory Visit: Payer: Self-pay

## 2017-04-10 ENCOUNTER — Emergency Department
Admission: EM | Admit: 2017-04-10 | Discharge: 2017-04-10 | Disposition: A | Discharge status: Home / Self Care | Payer: Medicaid Other | Attending: Emergency Medicine | Admitting: Emergency Medicine

## 2017-04-10 DIAGNOSIS — Z87891 Personal history of nicotine dependence: Secondary | ICD-10-CM | POA: Insufficient documentation

## 2017-04-10 DIAGNOSIS — Z91040 Latex allergy status: Secondary | ICD-10-CM | POA: Diagnosis not present

## 2017-04-10 DIAGNOSIS — L02412 Cutaneous abscess of left axilla: Secondary | ICD-10-CM | POA: Insufficient documentation

## 2017-04-10 DIAGNOSIS — Z79899 Other long term (current) drug therapy: Secondary | ICD-10-CM | POA: Insufficient documentation

## 2017-04-10 DIAGNOSIS — R2232 Localized swelling, mass and lump, left upper limb: Secondary | ICD-10-CM | POA: Diagnosis present

## 2017-04-10 DIAGNOSIS — F909 Attention-deficit hyperactivity disorder, unspecified type: Secondary | ICD-10-CM | POA: Diagnosis not present

## 2017-04-10 MED ORDER — LIDOCAINE HCL (PF) 1 % IJ SOLN
INTRAMUSCULAR | Status: AC
  Filled 2017-04-10: qty 5

## 2017-04-10 MED ORDER — LIDOCAINE-EPINEPHRINE 2 %-1:100000 IJ SOLN
1.7000 mL | Freq: Once | INTRAMUSCULAR | Status: DC
  Filled 2017-04-10: qty 1.7

## 2017-04-10 MED ORDER — SULFAMETHOXAZOLE-TRIMETHOPRIM 800-160 MG PO TABS: 1.0000 | ORAL_TABLET | Freq: Two times a day (BID) | ORAL | 0 refills | Status: DC

## 2017-04-10 NOTE — ED Provider Notes (Signed)
[facility] Emergency Department Provider Note   ____________________________________________    I have reviewed the triage vital signs and the nursing notes.   HISTORY  Chief Complaint Abscess (axilla )     HPI Melissa Khan is a 22 y.o. female who presents with complaints of an abscess in her left axilla.  Patient apparently had an I&D in December after 2 rounds of antibiotics and had mostly healed but has recently returned, she complains of pain which is burning in nature and severe worse with pressure in her left axilla.  She has been putting tea tree oil on it.  And hot compresses.   Past Medical History:  Diagnosis Date  . ADD (attention deficit disorder)   . Anxiety   . Bipolar 1 disorder (HCC)   . Depression   . PTSD (post-traumatic stress disorder)     Patient Active Problem List   Diagnosis Date Noted  . Adjustment disorder with mixed disturbance of emotions and conduct 11/23/2016  . Preeclampsia 10/20/2016  . Severe recurrent major depressive disorder with psychotic features (HCC)   . Severe episode of recurrent major depressive disorder, without psychotic features (HCC) 09/08/2015  . Sedative, hypnotic or anxiolytic use disorder, mild, abuse (HCC) 09/08/2015  . Cannabis use disorder, moderate, dependence (HCC) 09/08/2015  . Suicidal ideation 09/08/2015  . Self-inflicted laceration of wrist 09/08/2015    Past Surgical History:  Procedure Laterality Date  . CESAREAN SECTION N/A 10/21/2016   Procedure: CESAREAN SECTION;  Surgeon: Christeen Douglas, MD;  Location: [facility] ORS;  Service: Obstetrics;  Laterality: N/A;  Female born @ 1349 Apgars: 9/9 Weight: 5lbs 12 ozs  . EXTERNAL EAR SURGERY    . WISDOM TOOTH EXTRACTION      Prior to Admission medications   Medication Sig Start Date End Date Taking? Authorizing Provider  cyclobenzaprine (FLEXERIL) 10 MG tablet Take 0.5-1 tablets by mouth every 8 (eight) hours as needed. 12/15/16    [provider]  ferrous sulfate 325 (65 FE) MG tablet Take 1 tablet (325 mg total) by mouth daily with breakfast. Take with Vitamin C 10/24/16 12/23/16  Christeen Douglas, MD  norgestimate-ethinyl estradiol (ORTHO-CYCLEN,SPRINTEC,PREVIFEM) 0.25-35 MG-MCG tablet Take 1 tablet by mouth daily. 12/15/16   [provider]  sertraline (ZOLOFT) 50 MG tablet Take 50 mg by mouth daily. 12/15/16 12/15/17  [provider]  sulfamethoxazole-trimethoprim (BACTRIM DS,SEPTRA DS) 800-160 MG tablet Take 1 tablet by mouth 2 (two) times daily. 04/10/17   Jene Every, MD     Allergies Propofol; Shrimp [shellfish allergy]; Tramadol; and Latex  Family History  Problem Relation Age of Onset  . Breast cancer Paternal Aunt     Social History Social History   Tobacco Use  . Smoking status: Former Smoker    Last attempt to quit: 07/24/2014    Years since quitting: 2.7  . Smokeless tobacco: Never Used  Substance Use Topics  . Alcohol use: No    Alcohol/week: 0.0 oz    Comment: ONCE A YEAR  . Drug use: No    Comment: quit two weeks ago    Review of Systems  Constitutional: No fever/chills     Gastrointestinal: No abdominal pain.    Musculoskeletal: No arm pain Skin: Abscess as above      ____________________________________________   PHYSICAL EXAM:  VITAL SIGNS: ED Triage Vitals  Enc Vitals Group     BP 04/10/17 1902 115/73     Pulse Rate 04/10/17 1902 (!) 120  Resp 04/10/17 1902 18     Temp 04/10/17 1902 98.3 F (36.8 C)     Temp Source 04/10/17 1902 Oral     SpO2 04/10/17 1902 100 %     Weight 04/10/17 1902 77.1 kg (170 lb)     Height 04/10/17 1902 1.575 m (5\' 2" )     Head Circumference --      Peak Flow --      Pain Score 04/10/17 1905 10     Pain Loc --      Pain Edu? --      Excl. in GC? --      Constitutional: Alert and oriented.  Highly anxious  Nose: No congestion/rhinnorhea. Mouth/Throat: Mucous membranes are moist.   Cardiovascular:  Normal rate, regular rhythm.  Respiratory: Normal respiratory effort.  No retractions.   Neurologic:  Normal speech and language. No gross focal neurologic deficits are appreciated.   Skin:  Skin is warm, dry and intact.  Approximately 3 x 2 cm abscess in the left axilla, pointing fluctuant, erythematous but without surrounding cellulitis   ____________________________________________   LABS (all labs ordered are listed, but only abnormal results are displayed)  Labs Reviewed - No data to display ____________________________________________  EKG   ____________________________________________  RADIOLOGY  None ____________________________________________   PROCEDURES  Procedure(s) performed: Yes  .Marland Kitchen.Incision and Drainage Date/Time: 04/10/2017 8:24 PM Performed by: Jene EveryKinner, Ricketta Colantonio, MD Authorized by: Jene EveryKinner, Georgene Kopper, MD   Consent:    Consent obtained:  Verbal   Consent given by:  Patient   Risks discussed:  Bleeding, infection, incomplete drainage and pain   Alternatives discussed:  Observation and no treatment Location:    Type:  Abscess   Size:  3 x 2   Location: axilla. Pre-procedure details:    Skin preparation:  Betadine Anesthesia (see MAR for exact dosages):    Anesthesia method:  Local infiltration   Local anesthetic:  Lidocaine 1% WITH epi Procedure type:    Complexity:  Simple Procedure details:    Needle aspiration: no     Incision types:  Stab incision   Scalpel blade:  11   Drainage:  Purulent   Drainage amount:  Moderate   Wound treatment:  Wound left open   Packing materials:  None Post-procedure details:    Patient tolerance of procedure:  Tolerated with difficulty  Unable to pack as the patient cannot tolerate secondary to anxiety   Critical Care performed: No ____________________________________________   INITIAL IMPRESSION / ASSESSMENT AND PLAN / ED COURSE  Pertinent labs & imaging results that were available during my care of the  patient were reviewed by me and considered in my medical decision making (see chart for details).  I&D of left axilla abscess performed as above.  Will start on Bactrim, close follow-up with PCP or return to ED for wound recheck in 2 days.  Highly anxious patient barely able to tolerate the thought of the procedure, coaxed through procedure by mother   ____________________________________________   FINAL CLINICAL IMPRESSION(S) / ED DIAGNOSES  Final diagnoses:  Abscess of left axilla      NEW MEDICATIONS STARTED DURING THIS VISIT:  New Prescriptions   SULFAMETHOXAZOLE-TRIMETHOPRIM (BACTRIM DS,SEPTRA DS) 800-160 MG TABLET    Take 1 tablet by mouth 2 (two) times daily.     Note:  This document was prepared using Dragon voice recognition software and may include unintentional dictation errors.    Jene EveryKinner, Arelia Volpe, MD 04/10/17 2026

## 2017-04-10 NOTE — ED Triage Notes (Signed)
Pt states that "boils" under L armpit that has been there x 2 weeks. States has been using tea tree oil and antibiotic ointment. States hasn't been shaving or using Deoderant. Abscess to area has happened before, was lanced and given antibiotics, took a second round of antibiotics but didn't finish it so started taking those recently. Alert, oriented, ambulatory.

## 2017-04-10 NOTE — ED Notes (Signed)
Pt reports issues with absesses since dec, pt didn;t complete the course of antibiotics last time because they made her feel bad, pt reports hx of ingrown hairs and bumps on her buttocks

## 2017-04-14 ENCOUNTER — Emergency Department
Admission: EM | Admit: 2017-04-14 | Discharge: 2017-04-14 | Disposition: A | Discharge status: Home / Self Care | Payer: Medicaid Other | Attending: Emergency Medicine | Admitting: Emergency Medicine

## 2017-04-14 ENCOUNTER — Other Ambulatory Visit: Payer: Self-pay

## 2017-04-14 ENCOUNTER — Encounter: Payer: Self-pay | Admitting: Emergency Medicine

## 2017-04-14 DIAGNOSIS — Z87891 Personal history of nicotine dependence: Secondary | ICD-10-CM | POA: Insufficient documentation

## 2017-04-14 DIAGNOSIS — Z7289 Other problems related to lifestyle: Secondary | ICD-10-CM | POA: Diagnosis not present

## 2017-04-14 DIAGNOSIS — Z79899 Other long term (current) drug therapy: Secondary | ICD-10-CM | POA: Insufficient documentation

## 2017-04-14 DIAGNOSIS — Z91040 Latex allergy status: Secondary | ICD-10-CM | POA: Insufficient documentation

## 2017-04-14 DIAGNOSIS — L0291 Cutaneous abscess, unspecified: Secondary | ICD-10-CM

## 2017-04-14 DIAGNOSIS — F329 Major depressive disorder, single episode, unspecified: Secondary | ICD-10-CM | POA: Insufficient documentation

## 2017-04-14 DIAGNOSIS — L02412 Cutaneous abscess of left axilla: Secondary | ICD-10-CM | POA: Diagnosis not present

## 2017-04-14 LAB — URINE DRUG SCREEN, QUALITATIVE (ARMC ONLY)
Amphetamines, Ur Screen: NOT DETECTED
BARBITURATES, UR SCREEN: NOT DETECTED
Benzodiazepine, Ur Scrn: NOT DETECTED
CANNABINOID 50 NG, UR ~~LOC~~: POSITIVE — AB
Cocaine Metabolite,Ur ~~LOC~~: NOT DETECTED
MDMA (ECSTASY) UR SCREEN: NOT DETECTED
Methadone Scn, Ur: NOT DETECTED
Opiate, Ur Screen: NOT DETECTED
Phencyclidine (PCP) Ur S: NOT DETECTED
TRICYCLIC, UR SCREEN: NOT DETECTED

## 2017-04-14 LAB — URINALYSIS, COMPLETE (UACMP) WITH MICROSCOPIC
Bilirubin Urine: NEGATIVE
Glucose, UA: NEGATIVE mg/dL
HGB URINE DIPSTICK: NEGATIVE
Ketones, ur: NEGATIVE mg/dL
NITRITE: NEGATIVE
Protein, ur: 30 mg/dL — AB
Specific Gravity, Urine: 1.025 (ref 1.005–1.030)
pH: 5 (ref 5.0–8.0)

## 2017-04-14 LAB — CBC WITH DIFFERENTIAL/PLATELET
BASOS PCT: 1 %
Basophils Absolute: 0.1 10*3/uL (ref 0–0.1)
EOS ABS: 0 10*3/uL (ref 0–0.7)
Eosinophils Relative: 0 %
HCT: 31.1 % — ABNORMAL LOW (ref 35.0–47.0)
Hemoglobin: 9.5 g/dL — ABNORMAL LOW (ref 12.0–16.0)
Lymphocytes Relative: 8 %
Lymphs Abs: 0.8 10*3/uL — ABNORMAL LOW (ref 1.0–3.6)
MCH: 20.3 pg — AB (ref 26.0–34.0)
MCHC: 30.5 g/dL — AB (ref 32.0–36.0)
MCV: 66.4 fL — ABNORMAL LOW (ref 80.0–100.0)
MONO ABS: 0.5 10*3/uL (ref 0.2–0.9)
MONOS PCT: 5 %
Neutro Abs: 8.7 10*3/uL — ABNORMAL HIGH (ref 1.4–6.5)
Neutrophils Relative %: 86 %
Platelets: 303 10*3/uL (ref 150–440)
RBC: 4.69 MIL/uL (ref 3.80–5.20)
RDW: 18.4 % — AB (ref 11.5–14.5)
WBC: 10.1 10*3/uL (ref 3.6–11.0)

## 2017-04-14 LAB — COMPREHENSIVE METABOLIC PANEL
ALBUMIN: 4.2 g/dL (ref 3.5–5.0)
ALT: 16 U/L (ref 14–54)
ANION GAP: 10 (ref 5–15)
AST: 24 U/L (ref 15–41)
Alkaline Phosphatase: 57 U/L (ref 38–126)
BILIRUBIN TOTAL: 0.4 mg/dL (ref 0.3–1.2)
BUN: 14 mg/dL (ref 6–20)
CO2: 23 mmol/L (ref 22–32)
Calcium: 9.2 mg/dL (ref 8.9–10.3)
Chloride: 105 mmol/L (ref 101–111)
Creatinine, Ser: 0.85 mg/dL (ref 0.44–1.00)
GFR calc non Af Amer: >60 mL/min (ref >60)
GLUCOSE: 121 mg/dL — AB (ref 65–99)
POTASSIUM: 4.3 mmol/L (ref 3.5–5.1)
SODIUM: 138 mmol/L (ref 135–145)
TOTAL PROTEIN: 8.6 g/dL — AB (ref 6.5–8.1)

## 2017-04-14 LAB — POCT PREGNANCY, URINE: Preg Test, Ur: NEGATIVE

## 2017-04-14 LAB — ACETAMINOPHEN LEVEL: Acetaminophen (Tylenol), Serum: <10 ug/mL — ABNORMAL LOW (ref 10–30)

## 2017-04-14 LAB — SALICYLATE LEVEL

## 2017-04-14 LAB — ETHANOL: Alcohol, Ethyl (B): <10 mg/dL (ref <10)

## 2017-04-14 MED ORDER — LORAZEPAM 2 MG PO TABS
2.0000 mg | ORAL_TABLET | Freq: Once | ORAL | Status: AC
  Administered 2017-04-14: 2 mg via ORAL
  Filled 2017-04-14: qty 1

## 2017-04-14 MED ORDER — BACITRACIN ZINC 500 UNIT/GM EX OINT
TOPICAL_OINTMENT | Freq: Two times a day (BID) | CUTANEOUS | Status: DC
  Administered 2017-04-14: 1 via TOPICAL
  Filled 2017-04-14: qty 0.9

## 2017-04-14 MED ORDER — IBUPROFEN 600 MG PO TABS
600.0000 mg | ORAL_TABLET | Freq: Once | ORAL | Status: AC
  Administered 2017-04-14: 600 mg via ORAL
  Filled 2017-04-14: qty 1

## 2017-04-14 MED ORDER — LIDOCAINE-EPINEPHRINE (PF) 1 %-1:200000 IJ SOLN
INTRAMUSCULAR | Status: AC
  Filled 2017-04-14: qty 30

## 2017-04-14 NOTE — ED Notes (Signed)

## 2017-04-14 NOTE — ED Notes (Signed)
Susan RN, aware of IVC  

## 2017-04-14 NOTE — ED Triage Notes (Signed)
Mother called ems for pt superficially cutting her left wrist. Pt is a cutter and states the "kitty scratches" where not a suicide attempt. Pt states she "has been through this before" and is tired of being brought in - sees a psych already

## 2017-04-14 NOTE — ED Notes (Signed)
Darl PikesSusan RN, aware of IVC

## 2017-04-14 NOTE — ED Notes (Signed)
Pt moved into room 23 to speak with soc

## 2017-04-14 NOTE — ED Notes (Signed)
[facility] served IVC

## 2017-04-14 NOTE — ED Provider Notes (Signed)
[facility] Emergency Department Provider Note  ____________________________________________   None    (approximate)  I have reviewed the triage vital signs and the nursing notes.   HISTORY  Chief Complaint Psychiatric Evaluation   HPI Melissa Khan is a 22 y.o. female with a history of ADD, bipolar disorder as well as cutting who is presenting to the emergency department today after cutting her left forearm after her ex-boyfriend took the child to spend time with him.  She says that he did not kidney at the child.  However, she is very upset that the child is with the ex-boyfriend.  She says that she has a history of cutting and has no intent to kill herself but does this to control her pain.  She is also complaining of pain from a left axillary abscess.  She says she is already taking Bactrim but despite the Bactrim the abscesses worsening, getting bigger in size as well as increase in pain.   Past Medical History:  Diagnosis Date  . ADD (attention deficit disorder)   . Anxiety   . Bipolar 1 disorder (HCC)   . Depression   . PTSD (post-traumatic stress disorder)     Patient Active Problem List   Diagnosis Date Noted  . Adjustment disorder with mixed disturbance of emotions and conduct 11/23/2016  . Preeclampsia 10/20/2016  . Severe recurrent major depressive disorder with psychotic features (HCC)   . Severe episode of recurrent major depressive disorder, without psychotic features (HCC) 09/08/2015  . Sedative, hypnotic or anxiolytic use disorder, mild, abuse (HCC) 09/08/2015  . Cannabis use disorder, moderate, dependence (HCC) 09/08/2015  . Suicidal ideation 09/08/2015  . Self-inflicted laceration of wrist 09/08/2015    Past Surgical History:  Procedure Laterality Date  . CESAREAN SECTION N/A 10/21/2016   Procedure: CESAREAN SECTION;  Surgeon: Christeen Douglas, MD;  Location: [facility] ORS;  Service: Obstetrics;  Laterality: N/A;  Female born @  [birth year] Apgars: 9/9 Weight: 5lbs 12 ozs  . EXTERNAL EAR SURGERY    . WISDOM TOOTH EXTRACTION      Prior to Admission medications   Medication Sig Start Date End Date Taking? Authorizing Provider  cyclobenzaprine (FLEXERIL) 10 MG tablet Take 0.5-1 tablets by mouth every 8 (eight) hours as needed. 12/15/16   [provider]  ferrous sulfate 325 (65 FE) MG tablet Take 1 tablet (325 mg total) by mouth daily with breakfast. Take with Vitamin C 10/24/16 12/23/16  Christeen Douglas, MD  norgestimate-ethinyl estradiol (ORTHO-CYCLEN,SPRINTEC,PREVIFEM) 0.25-35 MG-MCG tablet Take 1 tablet by mouth daily. 12/15/16   [provider]  sertraline (ZOLOFT) 50 MG tablet Take 50 mg by mouth daily. 12/15/16 12/15/17  [provider]  sulfamethoxazole-trimethoprim (BACTRIM DS,SEPTRA DS) 800-160 MG tablet Take 1 tablet by mouth 2 (two) times daily. 04/10/17   Jene Every, MD    Allergies Propofol; Shrimp [shellfish allergy]; Tramadol; and Latex  Family History  Problem Relation Age of Onset  . Breast cancer Paternal Aunt     Social History Social History   Tobacco Use  . Smoking status: Former Smoker    Last attempt to quit: 07/24/2014    Years since quitting: 2.7  . Smokeless tobacco: Never Used  Substance Use Topics  . Alcohol use: No    Alcohol/week: 0.0 oz    Comment: ONCE A YEAR  . Drug use: No    Comment: quit two weeks ago    Review of Systems  Constitutional: No fever/chills Eyes: No visual changes. ENT:  No sore throat. Cardiovascular: Denies chest pain. Respiratory: Denies shortness of breath. Gastrointestinal: No abdominal pain.  No nausea, no vomiting.  No diarrhea.  No constipation. Genitourinary: Negative for dysuria. Musculoskeletal: Negative for back pain. Skin: Above Neurological: Negative for headaches, focal weakness or numbness.   ____________________________________________   PHYSICAL EXAM:  VITAL SIGNS: ED Triage Vitals [04/14/17 1941]   Enc Vitals Group     BP      Pulse      Resp      Temp      Temp src      SpO2      Weight 170 lb (77.1 kg)     Height 5\' 2"  (1.575 m)     Head Circumference      Peak Flow      Pain Score      Pain Loc      Pain Edu?      Excl. in GC?     Constitutional: Alert and oriented. Well appearing and in no acute distress. Eyes: Conjunctivae are normal.  Head: Atraumatic. Nose: No congestion/rhinnorhea. Mouth/Throat: Mucous membranes are moist.  Neck: No stridor.   Cardiovascular: Normal rate, regular rhythm. Grossly normal heart sounds.  Respiratory: Normal respiratory effort.  No retractions. Lungs CTAB. Gastrointestinal: Soft and nontender. No distention. Musculoskeletal: No lower extremity tenderness nor edema.  No joint effusions. Neurologic:  Normal speech and language. No gross focal neurologic deficits are appreciated. Skin: Multiple superficial lacerations in a horizontal Arin left volar forearm.  No active bleeding.  Wound edges are well approximated.  Lacerations are about 3-4 cm each.  No surrounding erythema, induration or pus.  Left axilla with a 3 x 2 cm, fluctuant boil that appears to be coming to ahead.  About a 1 cm will of surrounding erythema with induration.  It is very tender to palpation and warm. Psychiatric: Mood and affect are normal. Speech and behavior are normal.  ____________________________________________   LABS (all labs ordered are listed, but only abnormal results are displayed)  Labs Reviewed  CBC WITH DIFFERENTIAL/PLATELET - Abnormal; Notable for the following components:      Result Value   Hemoglobin 9.5 (*)    HCT 31.1 (*)    MCV 66.4 (*)    MCH 20.3 (*)    MCHC 30.5 (*)    RDW 18.4 (*)    Neutro Abs 8.7 (*)    Lymphs Abs 0.8 (*)    All other components within normal limits  COMPREHENSIVE METABOLIC PANEL - Abnormal; Notable for the following components:   Glucose, Bld 121 (*)    Total Protein 8.6 (*)    All other components within  normal limits  ACETAMINOPHEN LEVEL - Abnormal; Notable for the following components:   Acetaminophen (Tylenol), Serum <10 (*)    All other components within normal limits  URINALYSIS, COMPLETE (UACMP) WITH MICROSCOPIC - Abnormal; Notable for the following components:   Color, Urine YELLOW (*)    APPearance HAZY (*)    Protein, ur 30 (*)    Leukocytes, UA SMALL (*)    Bacteria, UA RARE (*)    Squamous Epithelial / LPF 6-30 (*)    All other components within normal limits  ETHANOL  SALICYLATE LEVEL  URINE DRUG SCREEN, QUALITATIVE ([facility] ONLY)  POC URINE PREG, ED  POCT PREGNANCY, URINE   ____________________________________________  EKG   ____________________________________________  RADIOLOGY   ____________________________________________   PROCEDURES  Procedure(s) performed:   Procedures  Critical Care performed:  ____________________________________________   INITIAL IMPRESSION / ASSESSMENT AND PLAN / ED COURSE  Pertinent labs & imaging results that were available during my care of the patient were reviewed by me and considered in my medical decision making (see chart for details).  DDX: Cutting, suicidal ideation, psychosis, bipolar disorder, abscess, cellulitis As part of my medical decision making, I reviewed the following data within the electronic MEDICAL RECORD NUMBER Notes from prior ED visits  ----------------------------------------- 8:46 PM on 04/14/2017 -----------------------------------------  Patient placed under involuntary commitment.  My plan was to drain the abscess.  However, the abscess ruptured on its own with a large amount of pus coming out.  The patient says that she does not want me to drain the abscess at this time because she says that the "anesthesia never works."  She plans on continuing warm compresses as well as her antibiotics.  I told her that this plan is okay as long as she returns to the emergency department immediately for any  worsening or concerning symptoms.  She is understanding of this plan and willing to comply.     ____________________________________________   FINAL CLINICAL IMPRESSION(S) / ED DIAGNOSES  Abscess.  Cutting.    NEW MEDICATIONS STARTED DURING THIS VISIT:  New Prescriptions   No medications on file     Note:  This document was prepared using Dragon voice recognition software and may include unintentional dictation errors.     Myrna BlazerSchaevitz, Taheerah Guldin Matthew, MD 04/14/17 [phone]

## 2017-04-14 NOTE — ED Notes (Signed)
Pt states she has a large abscess under left arm.  Pt states the abscess began to drain.  Took pt into bathroom and the site had significant green drainage.  Allowed abscess to drain as much as possible, and a gauze was applied with paper tape.

## 2017-04-14 NOTE — ED Notes (Signed)
Blood drawn and sent to lab.  Pt dressed out in burgundy scrubs.  Belongings include shoes, shirt and pants.  Belongings bagged, labelled and placed at nurses station.  Pt has a lip piercing that cannot be removed.  Pt will give urine sample as soon as she can.  Cup provided

## 2017-04-14 NOTE — ED Provider Notes (Signed)
-----------------------------------------   11:10 PM on 04/14/2017 -----------------------------------------   Height 5\' 2"  (1.575 m), weight 77.1 kg (170 lb), unknown if currently breastfeeding.  The patient had no acute events since last update.  Calm and cooperative at this time.  Patient evaluated by psychiatry and deemed appropriate for discharge.  [facility] Morene RankinsWindham.  Patient at this time is calm and cooperative.  She denies any suicidal ideation.  We discussed discharge instructions regarding her abscess.  She will continue with her antibiotics as well as warm compresses and will return immediately for any worsening or concerning symptoms.    Myrna BlazerSchaevitz, David Matthew, MD 04/14/17 [phone]

## 2017-04-14 NOTE — ED Notes (Signed)
ODS aware of IVC

## 2018-03-28 ENCOUNTER — Other Ambulatory Visit: Payer: Self-pay

## 2018-03-28 ENCOUNTER — Emergency Department
Admission: EM | Admit: 2018-03-28 | Discharge: 2018-03-28 | Disposition: A | Discharge status: Home / Self Care | Payer: Medicaid Other | Attending: Student in an Organized Health Care Education/Training Program | Admitting: Student in an Organized Health Care Education/Training Program

## 2018-03-28 ENCOUNTER — Emergency Department: Payer: Medicaid Other

## 2018-03-28 ENCOUNTER — Encounter: Payer: Self-pay | Admitting: Emergency Medicine

## 2018-03-28 DIAGNOSIS — Z79899 Other long term (current) drug therapy: Secondary | ICD-10-CM | POA: Insufficient documentation

## 2018-03-28 DIAGNOSIS — M62838 Other muscle spasm: Secondary | ICD-10-CM | POA: Insufficient documentation

## 2018-03-28 DIAGNOSIS — Z87891 Personal history of nicotine dependence: Secondary | ICD-10-CM | POA: Diagnosis not present

## 2018-03-28 DIAGNOSIS — Z91040 Latex allergy status: Secondary | ICD-10-CM | POA: Diagnosis not present

## 2018-03-28 DIAGNOSIS — M542 Cervicalgia: Secondary | ICD-10-CM | POA: Diagnosis present

## 2018-03-28 LAB — POCT PREGNANCY, URINE: Preg Test, Ur: NEGATIVE

## 2018-03-28 MED ORDER — BACLOFEN 10 MG PO TABS
5.0000 mg | ORAL_TABLET | Freq: Once | ORAL | Status: AC
  Administered 2018-03-28: 5 mg via ORAL
  Filled 2018-03-28 (×2): qty 0.5

## 2018-03-28 MED ORDER — NAPROXEN 500 MG PO TABS: 500.0000 mg | ORAL_TABLET | Freq: Two times a day (BID) | ORAL | 0 refills | Status: AC

## 2018-03-28 MED ORDER — BACLOFEN 10 MG PO TABS: 10.0000 mg | ORAL_TABLET | Freq: Three times a day (TID) | ORAL | 1 refills | Status: AC

## 2018-03-28 MED ORDER — HYDROCODONE-ACETAMINOPHEN 5-325 MG PO TABS: 1.0000 | ORAL_TABLET | Freq: Four times a day (QID) | ORAL | 0 refills | Status: AC | PRN

## 2018-03-28 MED ORDER — HYDROCODONE-ACETAMINOPHEN 5-325 MG PO TABS
1.0000 | ORAL_TABLET | Freq: Once | ORAL | Status: AC
  Administered 2018-03-28: 1 via ORAL
  Filled 2018-03-28: qty 1

## 2018-03-28 NOTE — ED Triage Notes (Signed)
Says she bent down to her dog and felt her neck pop.  Hurts to raise her arms.  By ems and has collar on.

## 2018-03-28 NOTE — ED Notes (Signed)
See triage note  Presents with pain to neck   States she bent down and then felt a pop to neck  Having increased pain with raising arms   Denies any fall

## 2018-03-28 NOTE — ED Provider Notes (Signed)
[facility] Emergency Department Provider Note   ____________________________________________   First MD Initiated Contact with Patient 03/28/18 1136     (approximate)  I have reviewed the triage vital signs and the nursing notes.   HISTORY  Chief Complaint Neck Pain   HPI Melissa Khan is a 23 y.o. female presents to the ED via EMS with complaint of cervical pain.  Patient states that she bent down to pet her dog when she felt a pop in her neck.  She denies any other injury to her neck.  She has had problems with her neck and upper back and has been seeing a Land.  She has not taken any over-the-counter medication prior to arrival and currently has a cervical collar on.  Patient states that she has been prescribed Flexeril in the past which she is unable to take as it causes drowsiness for "days".  Patient gives a history of x-rays in the chiropractor's office but no x-rays otherwise.  Mother states that she was thrown from a horse years ago but was not seen for it.  Currently she rates her pain as a 10/10.   Past Medical History:  Diagnosis Date  . ADD (attention deficit disorder)   . Anxiety   . Bipolar 1 disorder (HCC)   . Depression   . PTSD (post-traumatic stress disorder)     Patient Active Problem List   Diagnosis Date Noted  . Adjustment disorder with mixed disturbance of emotions and conduct 11/23/2016  . Preeclampsia 10/20/2016  . Severe recurrent major depressive disorder with psychotic features (HCC)   . Severe episode of recurrent major depressive disorder, without psychotic features (HCC) 09/08/2015  . Sedative, hypnotic or anxiolytic use disorder, mild, abuse (HCC) 09/08/2015  . Cannabis use disorder, moderate, dependence (HCC) 09/08/2015  . Suicidal ideation 09/08/2015  . Self-inflicted laceration of wrist 09/08/2015    Past Surgical History:  Procedure Laterality Date  . CESAREAN SECTION N/A 10/21/2016   Procedure:  CESAREAN SECTION;  Surgeon: Christeen Douglas, MD;  Location: [facility] ORS;  Service: Obstetrics;  Laterality: N/A;  Female born @ [birth year] Apgars: 9/9 Weight: 5lbs 12 ozs  . EXTERNAL EAR SURGERY    . WISDOM TOOTH EXTRACTION      Prior to Admission medications   Medication Sig Start Date End Date Taking? Authorizing Provider  baclofen (LIORESAL) 10 MG tablet Take 1 tablet (10 mg total) by mouth 3 (three) times daily. 03/28/18 03/28/19  Tommi Rumps, PA-C  ferrous sulfate 325 (65 FE) MG tablet Take 1 tablet (325 mg total) by mouth daily with breakfast. Take with Vitamin C 10/24/16 12/23/16  Christeen Douglas, MD  HYDROcodone-acetaminophen (NORCO/VICODIN) 5-325 MG tablet Take 1 tablet by mouth every 6 (six) hours as needed for moderate pain. 03/28/18   Tommi Rumps, PA-C  naproxen (NAPROSYN) 500 MG tablet Take 1 tablet (500 mg total) by mouth 2 (two) times daily with a meal. 03/28/18   Tommi Rumps, PA-C  norgestimate-ethinyl estradiol (ORTHO-CYCLEN,SPRINTEC,PREVIFEM) 0.25-35 MG-MCG tablet Take 1 tablet by mouth daily. 12/15/16   [provider]  sertraline (ZOLOFT) 50 MG tablet Take 50 mg by mouth daily. 12/15/16 12/15/17  [provider]    Allergies Propofol; Shrimp [shellfish allergy]; Tramadol; and Latex  Family History  Problem Relation Age of Onset  . Breast cancer Paternal Aunt     Social History Social History   Tobacco Use  . Smoking status: Former Smoker    Last attempt to quit: 07/24/2014  Years since quitting: 3.6  . Smokeless tobacco: Never Used  Substance Use Topics  . Alcohol use: No    Alcohol/week: 0.0 standard drinks    Comment: ONCE A YEAR  . Drug use: No    Comment: quit two weeks ago    Review of Systems Constitutional: No fever/chills Eyes: No visual changes. ENT: No sore throat. Cardiovascular: Denies chest pain. Respiratory: Denies shortness of breath. Gastrointestinal: No abdominal pain.  No nausea, no vomiting.  Genitourinary:  Negative for dysuria. Musculoskeletal: Positive for cervical pain. Skin: Negative for rash. Neurological: Negative for headaches, focal weakness or numbness. ____________________________________________   PHYSICAL EXAM:  VITAL SIGNS: ED Triage Vitals [03/28/18 1115]  Enc Vitals Group     BP      Pulse      Resp      Temp      Temp src      SpO2      Weight 200 lb (90.7 kg)     Height 5\' 2"  (1.575 m)     Head Circumference      Peak Flow      Pain Score 10     Pain Loc      Pain Edu?      Excl. in GC?    Constitutional: Alert and oriented. Well appearing and in no acute distress. Eyes: Conjunctivae are normal.  Head: Atraumatic. Nose: No congestion/rhinnorhea. Neck: No stridor.  Trauma cervical collar was removed by patient.  Patient is lying on the bed eating graham crackers.  Moderate tenderness on palpation of cervical spine posteriorly and paravertebral muscles bilaterally.  Range of motion is restricted and increases pain with any movement.  No evidence of injury such as abrasion or discoloration. Cardiovascular: Normal rate, regular rhythm. Grossly normal heart sounds.  Good peripheral circulation. Respiratory: Normal respiratory effort.  No retractions. Lungs CTAB. Musculoskeletal: Moderate tenderness on palpation of the bony processes of the upper thoracic spine and paravertebral muscles bilaterally.  No soft tissue edema or discoloration is present.  Range of motion is restricted secondary to pain.  No step-offs or deformity is noted.  Lumbar spine is nontender.  Range of motion of the lower extremities is without restriction.  Good muscle strength bilaterally. Neurologic:  Normal speech and language. No gross focal neurologic deficits are appreciated.  Straight leg raises are 2+ bilaterally.  No gait instability. Skin:  Skin is warm, dry and intact. No rash noted. Psychiatric: Mood and affect are normal. Speech and behavior are  normal.  ____________________________________________   LABS (all labs ordered are listed, but only abnormal results are displayed)  Labs Reviewed  POCT PREGNANCY, URINE  POC URINE PREG, ED     RADIOLOGY   Official radiology report(s): Ct Cervical Spine Wo Contrast  Result Date: 03/28/2018 CLINICAL DATA:  Neck pain. EXAM: CT CERVICAL SPINE WITHOUT CONTRAST TECHNIQUE: Multidetector CT imaging of the cervical spine was performed without intravenous contrast. Multiplanar CT image reconstructions were also generated. COMPARISON:  None. FINDINGS: Alignment: Normal Skull base and vertebrae: No acute fracture. No primary bone lesion or focal pathologic process. Soft tissues and spinal canal: No prevertebral fluid or swelling. No visible canal hematoma. Disc levels: Very generous spinal canal. No large disc protrusions, spinal or foraminal stenosis. Upper chest: No significant findings. Other: No neck mass or adenopathy. IMPRESSION: Normal cervical spine CT scan. Electronically Signed   By: Rudie MeyerP.  Gallerani M.D.   On: 03/28/2018 13:30   Ct Thoracic Spine Wo Contrast  Result Date: 03/28/2018  CLINICAL DATA:  Neck and back pain.  No known injury. EXAM: CT THORACIC SPINE WITHOUT CONTRAST TECHNIQUE: Multidetector CT images of the thoracic were obtained using the standard protocol without intravenous contrast. COMPARISON:  None. FINDINGS: Alignment: Normal Vertebrae: Normal.  No fracture or bone lesion. Paraspinal and other soft tissues: Normal. Disc levels: No large disc protrusions, spinal or foraminal stenosis. IMPRESSION: Unremarkable thoracic spine CT scan. Electronically Signed   By: Rudie Meyer M.D.   On: 03/28/2018 13:28   ____________________________________________   PROCEDURES  Procedure(s) performed: None  Procedures  Critical Care performed: No  ____________________________________________   INITIAL IMPRESSION / ASSESSMENT AND PLAN / ED COURSE  As part of my medical decision  making, I reviewed the following data within the electronic MEDICAL RECORD NUMBER Notes from prior ED visits and [place] Controlled Substance Database  Patient presents to the ED via EMS with complaint of cervical pain after she bent over to pet her dog and felt her neck pop.  She has had difficulty with her neck and was seeing a [facility].  She denies any recent injury but has had multiple injuries to her neck from being thrown from horses in the past.  Physical exam shows marked tenderness on palpation of cervical and thoracic spine along with paravertebral muscles bilaterally.  Range of motion is restricted and increases patient's pain.  No obvious deformity is noted.  CT cervical and thoracic spine was obtained and no acute abnormality was noted.  Patient was offered an injection for pain and refused since she does not like to have injections.  She also has had Flexeril in the past and refuses to take muscle relaxants as they make her "sleepy".  She finally agreed to try baclofen 5 mg while in the ED and was given Norco for pain.  At the time of discharge patient denied feeling sleepy.  Patient was given a prescription to continue with baclofen 10 mg 3 times daily as needed, naproxen 500 mg twice daily and Norco 5/325 1 every 6 hours as needed for pain.  Patient is to follow-up with her PCP if any continued problems.  She is to return to the ED if any severe worsening of her symptoms.  ____________________________________________   FINAL CLINICAL IMPRESSION(S) / ED DIAGNOSES  Final diagnoses:  Muscle spasms of neck     ED Discharge Orders         Ordered    baclofen (LIORESAL) 10 MG tablet  3 times daily     03/28/18 1451    HYDROcodone-acetaminophen (NORCO/VICODIN) 5-325 MG tablet  Every 6 hours PRN     03/28/18 1451    naproxen (NAPROSYN) 500 MG tablet  2 times daily with meals     03/28/18 1451           Note:  This document was prepared using Dragon voice recognition software and may  include unintentional dictation errors.    Tommi Rumps, PA-C 03/28/18 1636    Willy Eddy, MD 03/28/18 [phone]

## 2018-03-28 NOTE — Discharge Instructions (Addendum)
Follow-up with [facility] acute care if any continued problems.  Begin taking medication as directed.  Begin taking baclofen 3 times a day as needed for muscle spasms, Norco 1 every 6 hours as needed for pain.  Naproxen is twice a day for inflammation.  Also use ice or heat to your neck as needed.

## 2018-03-28 NOTE — ED Notes (Signed)
Patient to ED via EMS complaining of pain in neck.  Neuro intact.  Allergic to Tramadol.  Denies trauma.  VSS.

## 2020-05-13 IMAGING — CT CT T SPINE W/O CM
3 series · 10 of 33 positions shown, 12 images · non-contrast
Comparison: None.

CLINICAL DATA: Neck and back pain.  No known injury.

EXAM:
CT THORACIC SPINE WITHOUT CONTRAST
TECHNIQUE: Multidetector CT images of the thoracic were obtained using the
standard protocol without intravenous contrast.

[Series 2: t spine soft · axial · 0.38mm/px · z∈[-484,-334]mm · 2 of 164 slices shown, 3 images]
[im 51/164  soft-tissue]
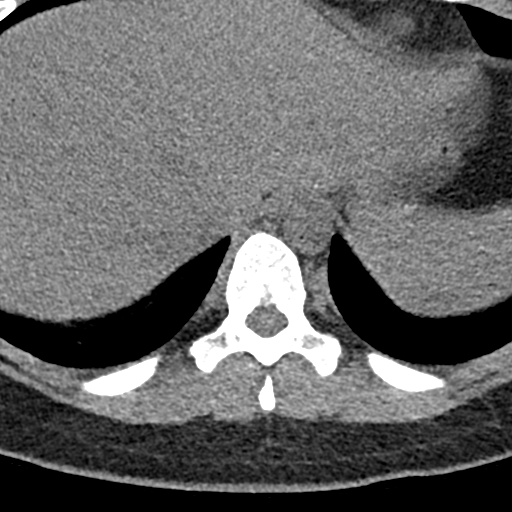
[im 51/164  bone]
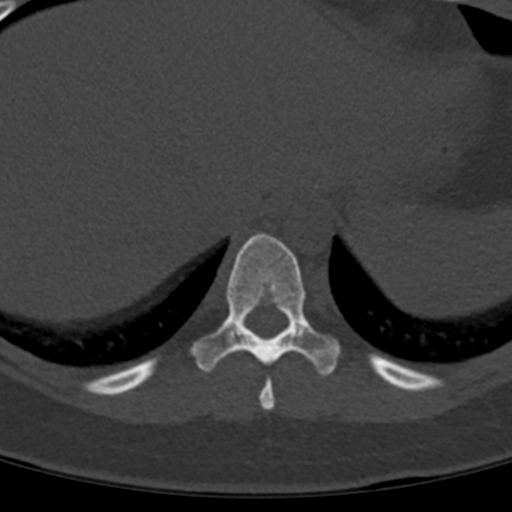
[im 126/164  bone]
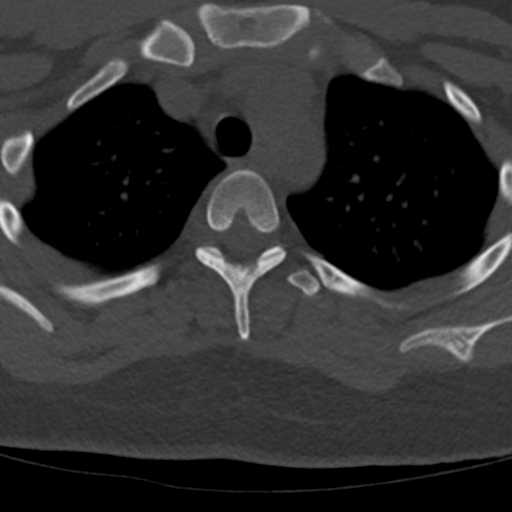

[Series 4: sagittal bone · sagittal · 0.32mm/px · 5 of 67 slices shown, 6 images]
[im 23/67  bone]
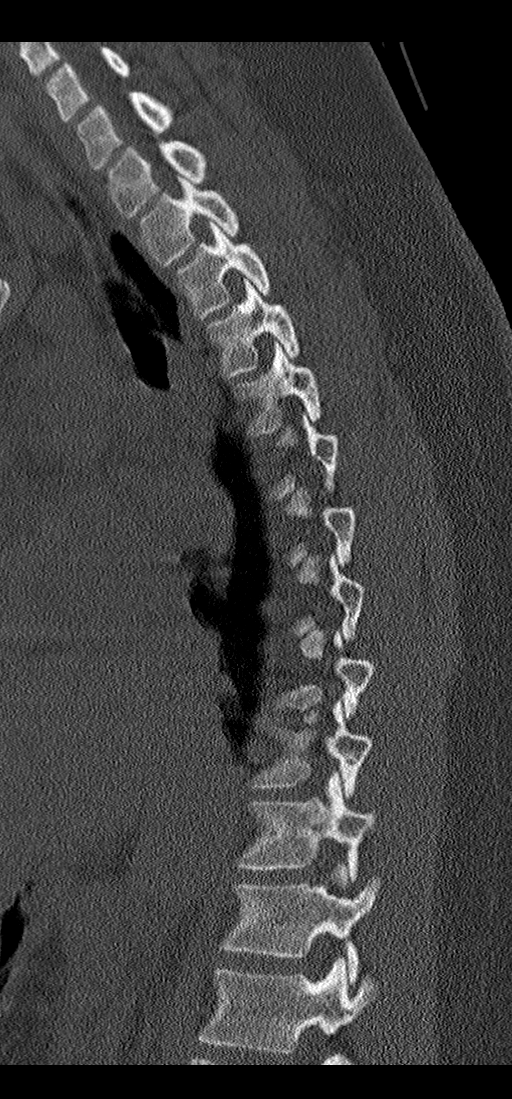
[im 28/67  bone]
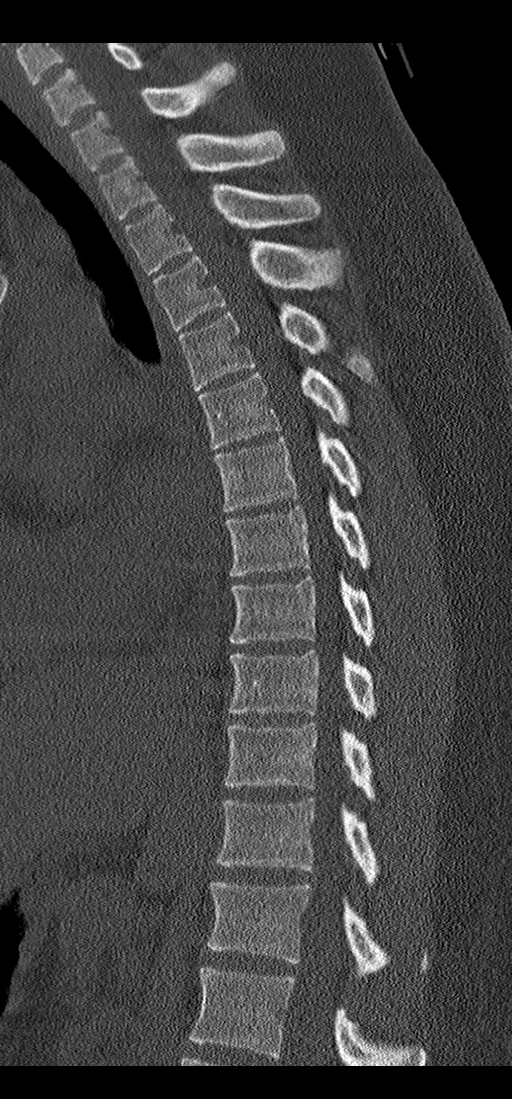
[im 34/67  soft-tissue]
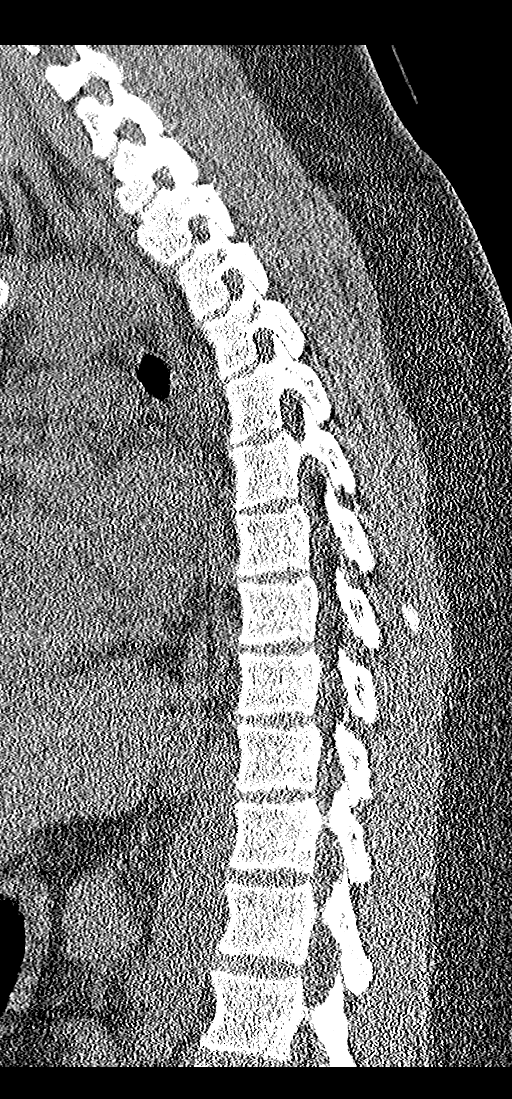
[im 34/67  bone]
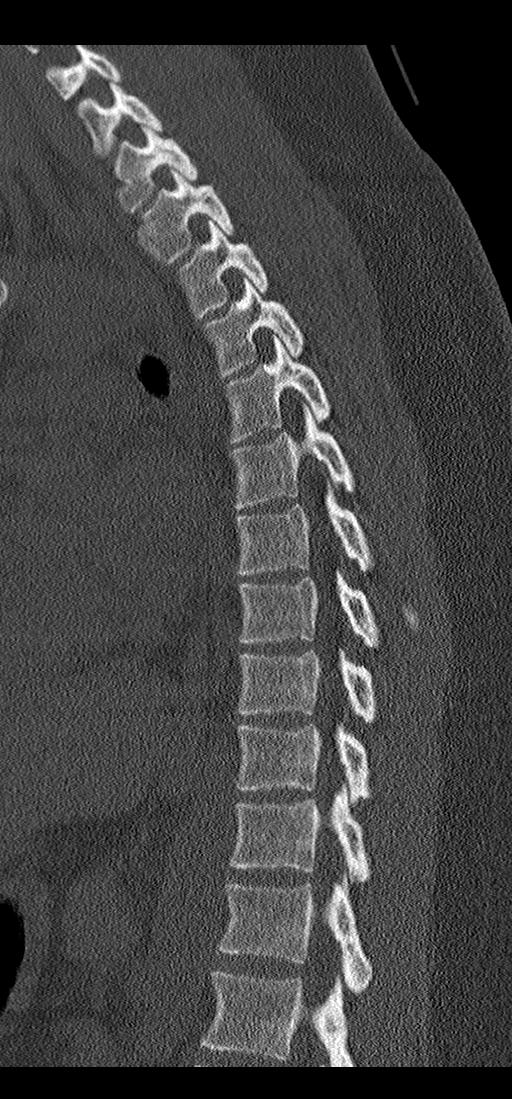
[im 39/67  bone]
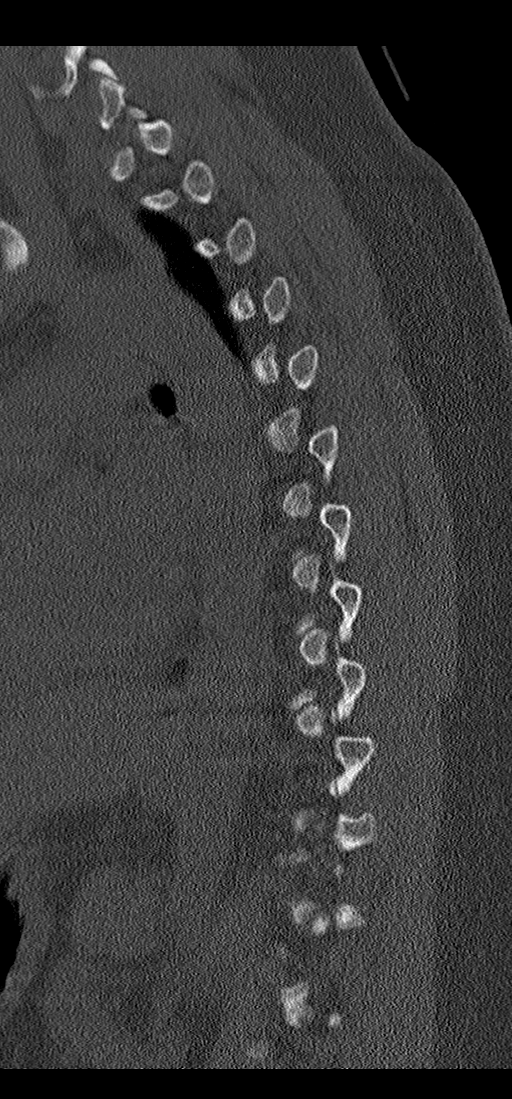
[im 45/67  bone]
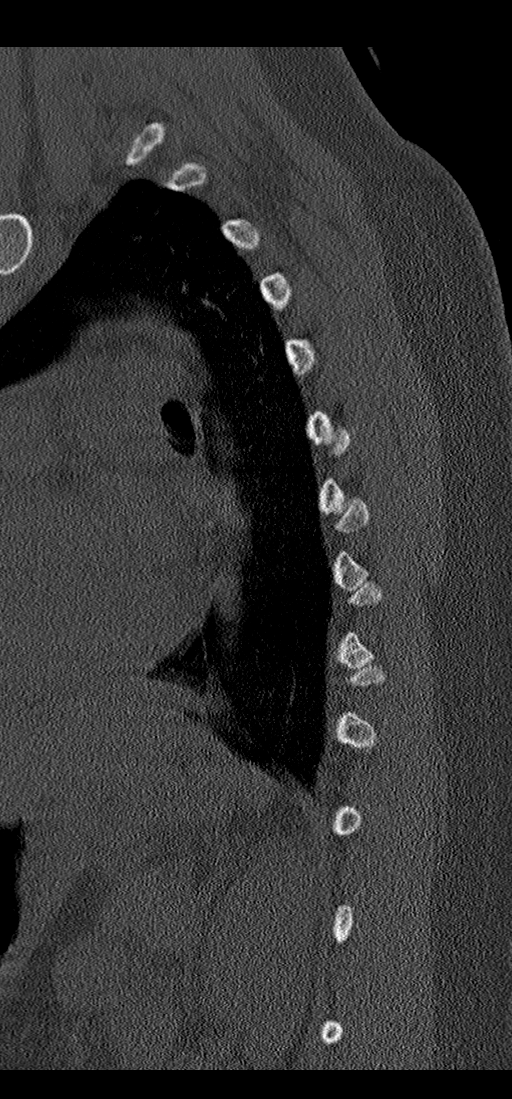

[Series 5: coronal bone · coronal · 0.44mm/px · 3 of 131 slices shown]
[im 27/131  bone]
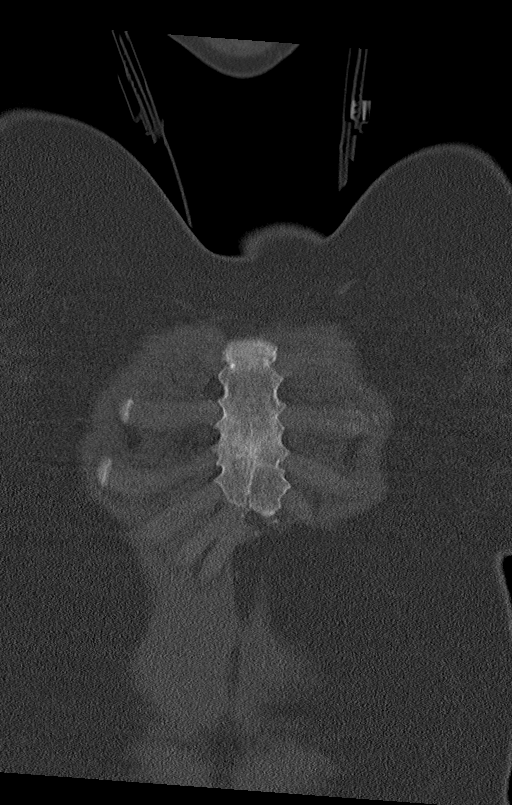
[im 53/131  bone]
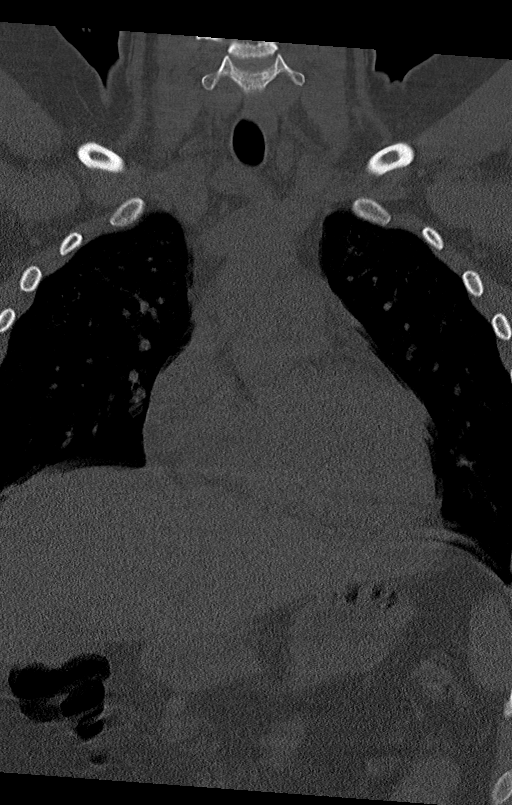
[im 79/131  bone]
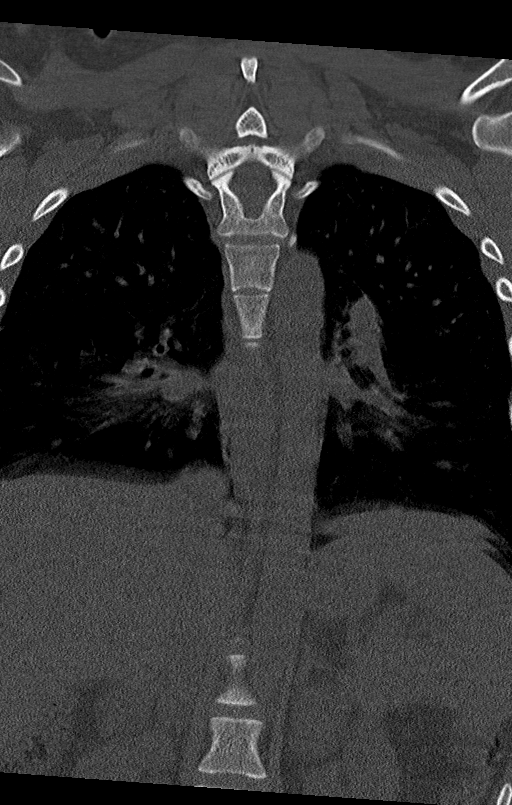

[10 of 33 positions shown; findings below may reference images not displayed]

FINDINGS: Alignment: Normal

Vertebrae: Normal.  No fracture or bone lesion.

Paraspinal and other soft tissues: Normal.

Disc levels: No large disc protrusions, spinal or foraminal
stenosis.
IMPRESSION: Unremarkable thoracic spine CT scan.

## 2020-05-13 IMAGING — CT CT CERVICAL SPINE W/O CM
3 of 4 series · 9 of 33 positions shown, 11 images · non-contrast
Comparison: None.

CLINICAL DATA: Neck pain.

EXAM:
CT CERVICAL SPINE WITHOUT CONTRAST
TECHNIQUE: Multidetector CT imaging of the cervical spine was performed without
intravenous contrast. Multiplanar CT image reconstructions were also
generated.

[Series 4: sagittal bone · sagittal · 0.30mm/px · 5 of 223 slices shown, 6 images]
[im 75/223  bone]
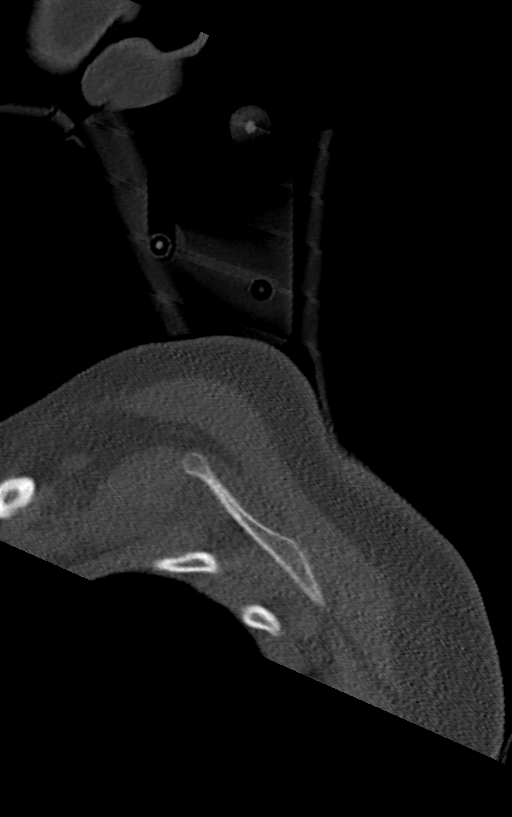
[im 93/223  bone]
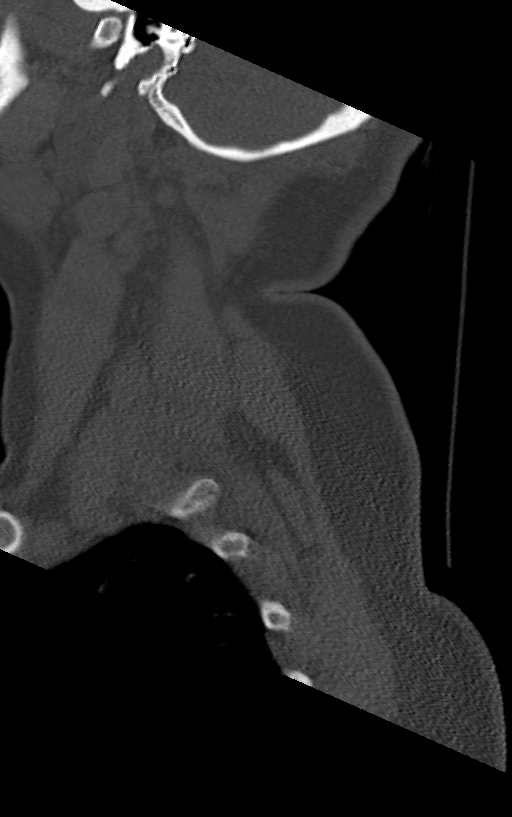
[im 112/223  soft-tissue]
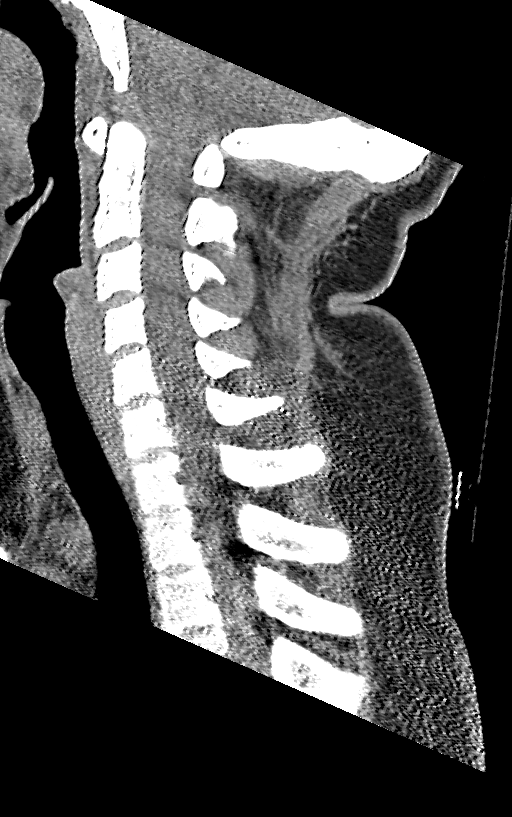
[im 112/223  bone]
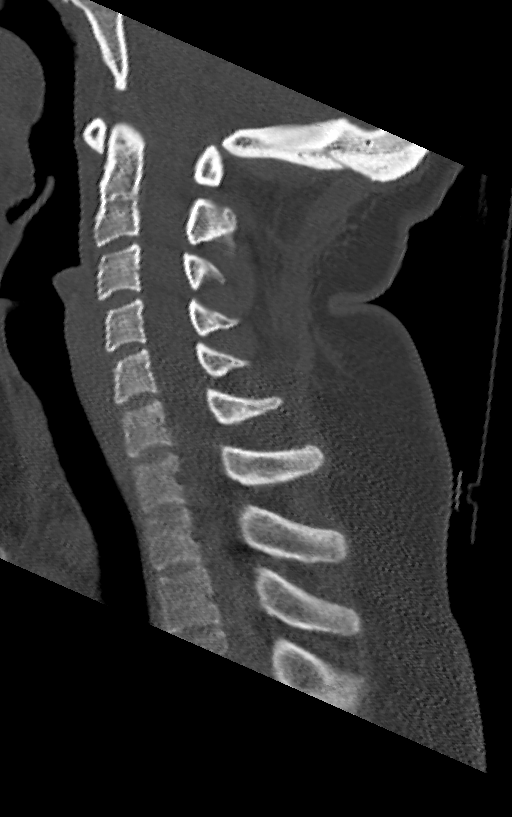
[im 130/223  bone]
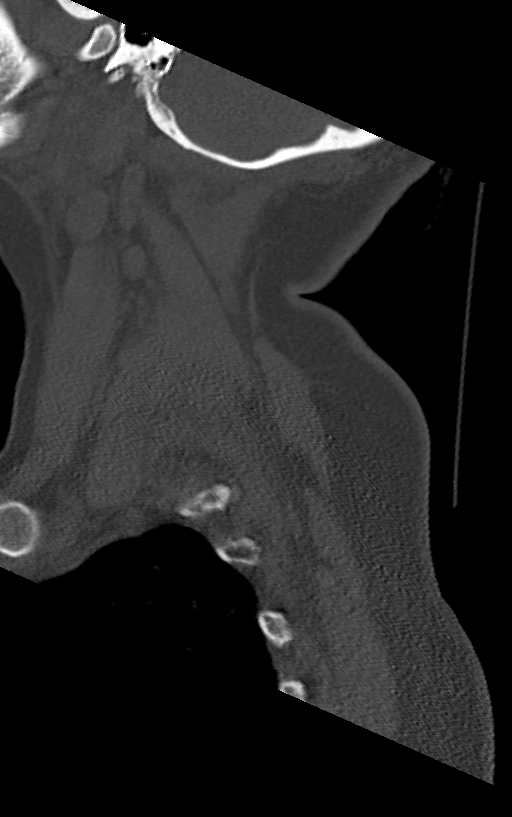
[im 149/223  bone]
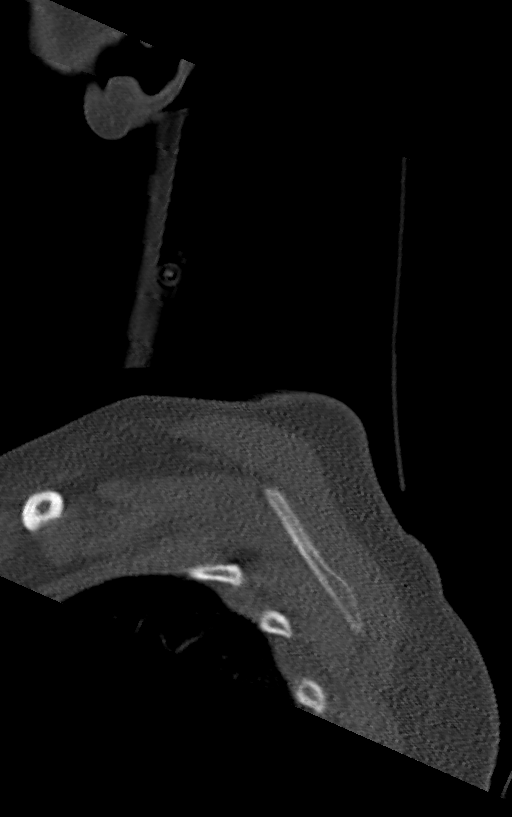

[Series 5: coronal bone · coronal · 0.38mm/px · 3 of 149 slices shown]
[im 30/149  bone]
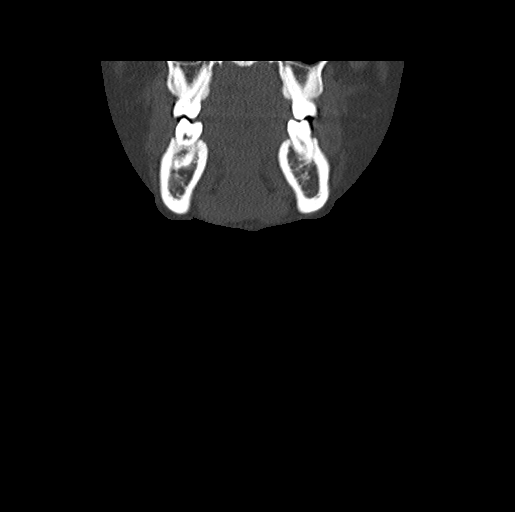
[im 60/149  bone]
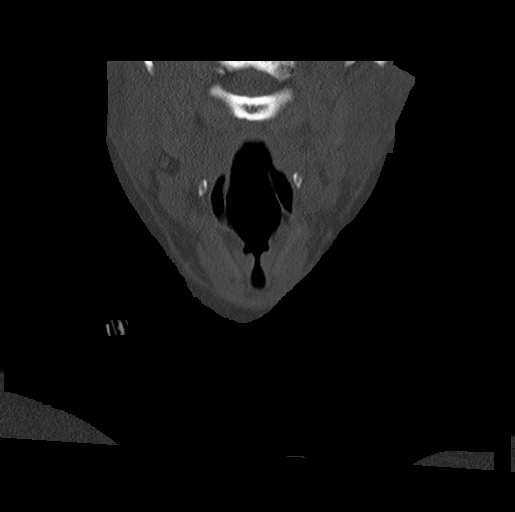
[im 89/149  bone]
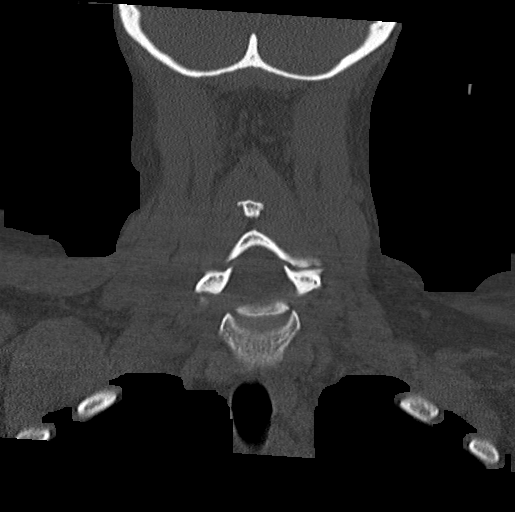

[Series 6: orthogonal bone · axial · 0.26mm/px · z∈[-300,-300]mm · 1 of 97 slices shown, 2 images]
[im 55/97  soft-tissue]
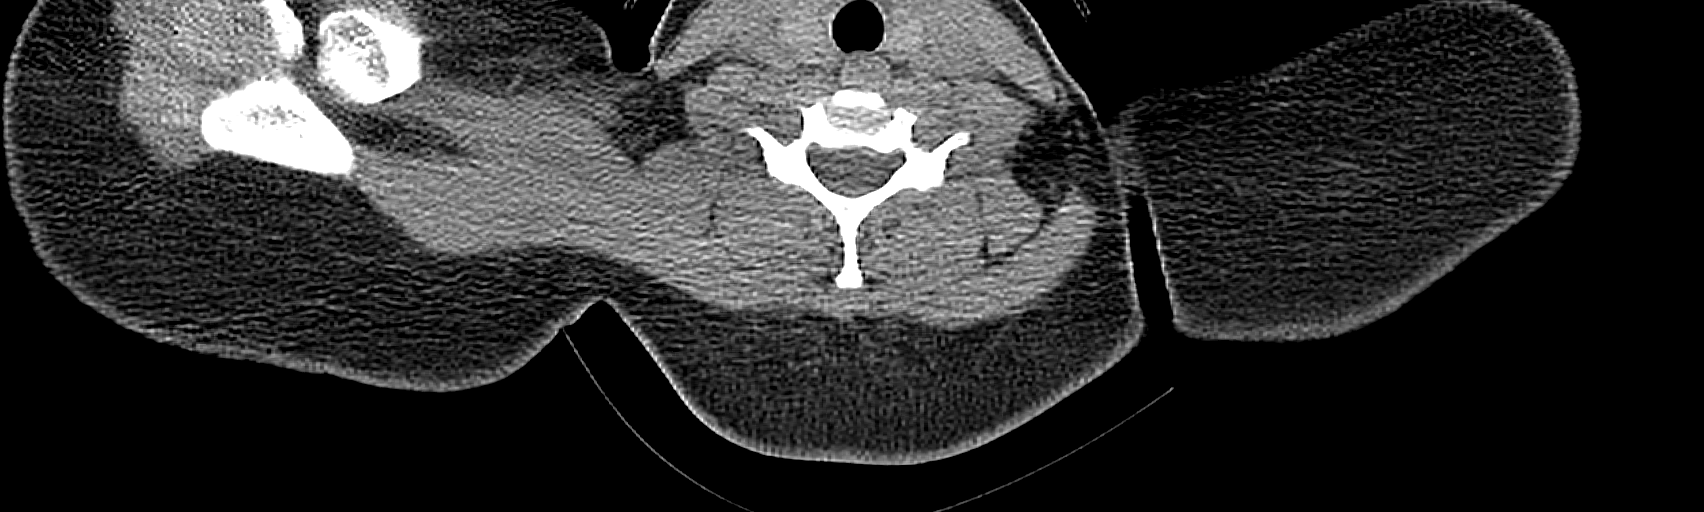
[im 55/97  bone]
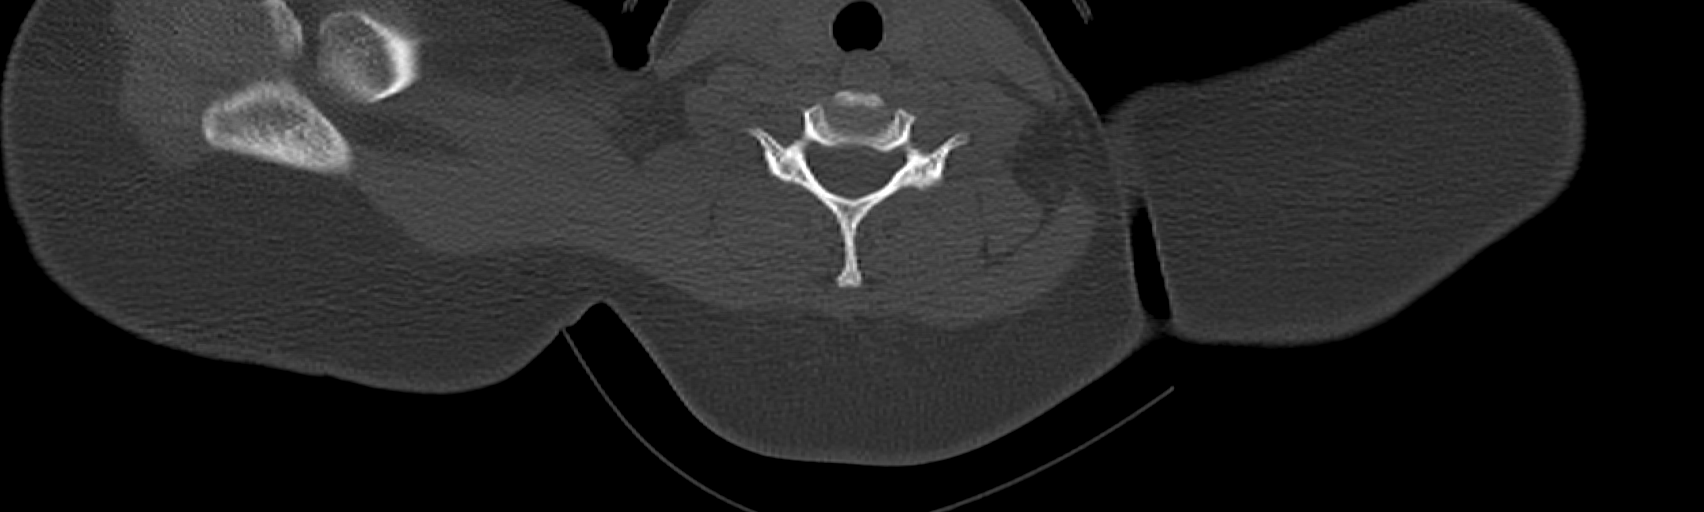

[9 of 33 positions shown; findings below may reference images not displayed]

FINDINGS: Alignment: Normal

Skull base and vertebrae: No acute fracture. No primary bone lesion
or focal pathologic process.

Soft tissues and spinal canal: No prevertebral fluid or swelling. No
visible canal hematoma.

Disc levels: Very generous spinal canal. No large disc protrusions,
spinal or foraminal stenosis.

Upper chest: No significant findings.

Other: No neck mass or adenopathy.
IMPRESSION: Normal cervical spine CT scan.

## 2022-08-26 ENCOUNTER — Ambulatory Visit
Admission: EM | Admit: 2022-08-26 | Discharge: 2022-08-26 | Disposition: A | Discharge status: Home / Self Care | Payer: Medicaid Other | Attending: Emergency Medicine | Admitting: Emergency Medicine

## 2022-08-26 DIAGNOSIS — M546 Pain in thoracic spine: Secondary | ICD-10-CM

## 2022-08-26 MED ORDER — METHOCARBAMOL 500 MG PO TABS: 500.0000 mg | ORAL_TABLET | Freq: Two times a day (BID) | ORAL | 0 refills | Status: AC | PRN

## 2022-08-26 NOTE — ED Provider Notes (Signed)
Melissa Khan    CSN: [id number] Arrival date & time: 08/26/22  1611      History   Chief Complaint Chief Complaint  Patient presents with   Back Pain    HPI Melissa Khan is a 27 y.o. female.  Patient presents with mid back pain x 3 days.  No falls or injury.  She describes the pain as intermittent, sharp, stabbing.  She states it started after sleeping on a soft mattress at her mother's house.  She reports history of similar discomfort in the past that was treated with muscle relaxers.  She has been taking leftover tizanidine from previous back pain.  She denies numbness, weakness, paresthesias, fever, chills, cough, shortness of breath, abdominal pain, dysuria, or other symptoms.  Her medical history includes bipolar 1 disorder, anxiety, depression, PTSD, ADD.  The history is provided by the patient and medical records.    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   Cannabis use disorder, moderate, dependence (HCC) 09/08/2015   Suicidal ideation 09/08/2015   Self-inflicted laceration of wrist (HCC) 09/08/2015    Past Surgical History:  Procedure Laterality Date   CESAREAN SECTION N/A 10/21/2016   Procedure: CESAREAN SECTION;  Surgeon: Christeen Douglas, MD;  Location: [facility] ORS;  Service: Obstetrics;  Laterality: N/A;  Female born @ [birth year] Apgars: 9/9 Weight: 5lbs 12 ozs   EXTERNAL EAR SURGERY     WISDOM TOOTH EXTRACTION       OB History     Gravida  1   Para      Term      Preterm      AB      Living  0      SAB      IAB      Ectopic      Multiple      Live Births               Home Medications    Prior to Admission medications   Medication Sig Start Date End Date Taking? Authorizing Provider  methocarbamol (ROBAXIN) 500 MG tablet Take 1 tablet (500 mg total) by mouth 2 (two) times daily as needed for muscle spasms. 08/26/22  Yes Mickie Bail, NP  ferrous sulfate 325 (65 FE) MG tablet Take 1 tablet (325 mg total) by mouth daily with breakfast. Take with Vitamin C Patient not taking: Reported on 08/26/2022 10/24/16 12/23/16  Christeen Douglas, MD  HYDROcodone-acetaminophen (NORCO/VICODIN) 5-325 MG tablet Take 1 tablet by mouth every 6 (six) hours as needed for moderate pain. Patient not taking: Reported on 08/26/2022 03/28/18   Tommi Rumps, PA-C  naproxen (NAPROSYN) 500 MG tablet Take 1 tablet (500 mg total) by mouth 2 (two) times daily with a meal. Patient not taking: Reported on 08/26/2022 03/28/18   Tommi Rumps, PA-C  norgestimate-ethinyl estradiol (ORTHO-CYCLEN,SPRINTEC,PREVIFEM) 0.25-35 MG-MCG tablet Take 1 tablet by mouth daily. Patient not taking: Reported on 08/26/2022 12/15/16  [provider]  sertraline (ZOLOFT) 50 MG tablet Take 50 mg by mouth daily. Patient not taking: Reported on 08/26/2022 12/15/16 12/15/17  [provider]    Family History Family History  Problem Relation Age of Onset   Breast cancer Paternal Aunt     Social History Social History   Tobacco Use   Smoking status: Former    Types: Cigarettes    Quit date: 07/24/2014    Years since quitting: 8.0   Smokeless tobacco: Never  Substance Use Topics   Alcohol use: No    Alcohol/week: 0.0 standard drinks of alcohol    Comment: ONCE A YEAR   Drug use: No    Comment: quit two weeks ago     Allergies   Propofol, Shrimp [shellfish allergy], Tramadol, and  Latex   Review of Systems Review of Systems  Constitutional:  Negative for chills and fever.  Respiratory:  Negative for cough and shortness of breath.   Cardiovascular:  Negative for chest pain and palpitations.  Gastrointestinal:  Negative for abdominal pain, nausea and vomiting.  Genitourinary:  Negative for dysuria and hematuria.  Musculoskeletal:  Positive for back pain. Negative for arthralgias, gait problem and joint swelling.  Skin:  Negative for color change and rash.  Neurological:  Negative for weakness and numbness.  All other systems reviewed and are negative.    Physical Exam Triage Vital Signs ED Triage Vitals  Enc Vitals Group     BP      Pulse      Resp      Temp      Temp src      SpO2      Weight      Height      Head Circumference      Peak Flow      Pain Score      Pain Loc      Pain Edu?      Excl. in GC?    No data found.  Updated Vital Signs BP 98/64   Pulse 88   Temp 97.7 F (36.5 C)   Resp 18   LMP 08/13/2022   SpO2 97%   Visual Acuity Right Eye Distance:   Left Eye Distance:   Bilateral Distance:    Right Eye Near:   Left Eye Near:    Bilateral Near:     Physical Exam Vitals and nursing note reviewed.  Constitutional:      General: She is not in acute distress.    Appearance: Normal appearance. She is well-developed. She is not ill-appearing.  HENT:     Mouth/Throat:     Mouth: Mucous membranes are moist.  Cardiovascular:     Rate and Rhythm: Normal rate and regular rhythm.     Heart sounds: Normal heart sounds.  Pulmonary:     Effort: Pulmonary effort is normal. No respiratory distress.     Breath sounds: Normal breath sounds.  Musculoskeletal:        General: Tenderness present. No swelling, deformity or signs of injury. Normal range of motion.     Cervical back: Neck supple.     Comments: Midline thoracic back pain.  Skin:    General: Skin is warm and dry.     Capillary Refill: Capillary refill takes less than 2  seconds.     Findings: No bruising, erythema, lesion or rash.  Neurological:     General: No focal deficit present.     Mental Status: She  is alert and oriented to person, place, and time.     Sensory: No sensory deficit.     Motor: No weakness.     Gait: Gait normal.  Psychiatric:        Mood and Affect: Mood normal.        Behavior: Behavior normal.      UC Treatments / Results  Labs (all labs ordered are listed, but only abnormal results are displayed) Labs Reviewed - No data to display  EKG   Radiology No results found.  Procedures Procedures (including critical care time)  Medications Ordered in UC Medications - No data to display  Initial Impression / Assessment and Plan / UC Course  I have reviewed the triage vital signs and the nursing notes.  Pertinent labs & imaging results that were available during my care of the patient were reviewed by me and considered in my medical decision making (see chart for details).    Midline thoracic back pain.  Patient symptoms started after she slept on a soft mattress at her mother's house.  She reports history of similar back pain that was treated with a muscle relaxer.  Discussed OTC treatments such as ibuprofen.  Also treating with methocarbamol and precautions for drowsiness with this medication discussed.  Instructed patient to follow-up with her PCP or an orthopedist if she is not improving.  Contact information for on-call Ortho provided.  ED precautions discussed.  Education provided on back pain.  Work note provided per patient request.  She agrees to plan of care.  Final Clinical Impressions(s) / UC Diagnoses   Final diagnoses:  Acute midline thoracic back pain     Discharge Instructions      Take ibuprofen as needed for discomfort.  Take the muscle relaxer as needed for muscle spasm; Do not drive, operate machinery, or drink alcohol with this medication as it can cause drowsiness.   Follow up with your primary  care provider or an orthopedist if your symptoms are not improving.         ED Prescriptions     Medication Sig Dispense Auth. Provider   methocarbamol (ROBAXIN) 500 MG tablet Take 1 tablet (500 mg total) by mouth 2 (two) times daily as needed for muscle spasms. 10 tablet Mickie Bail, NP      I have reviewed the PDMP during this encounter.   Mickie Bail, NP 08/26/22 1734

## 2022-08-26 NOTE — Discharge Instructions (Addendum)
Take ibuprofen as needed for discomfort.  Take the muscle relaxer as needed for muscle spasm; Do not drive, operate machinery, or drink alcohol with this medication as it can cause drowsiness.   Follow up with your primary care provider or an orthopedist if your symptoms are not improving.     

## 2022-08-26 NOTE — ED Triage Notes (Signed)
Patient to Urgent Care with complaints of centralized back pain that started on Monday. Reports pain started when she was laying in the bed. States the pain is sharp and stabbing. Reports that she works at [employer] and repeatedly lifts at work. Lifted heavy dog two weeks ago.  Reports hx of the same. Diagnosed with muscle spasms.   Has been taking tizanidine from a previous prescription but has very little relief.

## 2022-08-27 ENCOUNTER — Emergency Department
Admission: EM | Admit: 2022-08-27 | Discharge: 2022-08-27 | Disposition: A | Discharge status: Home / Self Care | Payer: Medicaid Other | Attending: Student in an Organized Health Care Education/Training Program | Admitting: Student in an Organized Health Care Education/Training Program

## 2022-08-27 ENCOUNTER — Other Ambulatory Visit: Payer: Self-pay

## 2022-08-27 ENCOUNTER — Emergency Department: Payer: Medicaid Other

## 2022-08-27 DIAGNOSIS — M546 Pain in thoracic spine: Secondary | ICD-10-CM | POA: Insufficient documentation

## 2022-08-27 DIAGNOSIS — M549 Dorsalgia, unspecified: Secondary | ICD-10-CM | POA: Diagnosis present

## 2022-08-27 MED ORDER — MELOXICAM 15 MG PO TABS: 15.0000 mg | ORAL_TABLET | Freq: Every day | ORAL | 1 refills | Status: AC

## 2022-08-27 NOTE — ED Triage Notes (Signed)
Pt to ED via POV c/o back pain. Pt has hx of chronic back pain but reports pain has moved upwards. Pain started on Monday when she was laying in bed. States pain feels like pressure. Was seen at [facility] yesterday for same. Has been prescribed medication for it but reports has not been working. Denies cp, sob, dizziness, fevers

## 2022-08-27 NOTE — ED Provider Notes (Signed)
[facility] Provider Note  Patient Contact: 9:28 PM (approximate)   History   Back Pain   HPI  Melissa Khan is a 27 y.o. female with a history of anxiety, depression, PTSD and bipolar 1 disorder, presents to the emergency department with upper back pain.  Patient reports that she was lying in bed on Monday when she felt sudden sharp pain in her upper back and then had pain all over her body.  She denies weakness in the upper extremities.  No associated shortness of breath, cough or lower extremity swelling.  No recent surgery, prolonged immobilization or daily smoking.  Patient states that she has tried a muscle relaxer which has not relieved her symptoms.      Physical Exam   Triage Vital Signs: ED Triage Vitals  Enc Vitals Group     BP 08/27/22 1938 123/79     Pulse Rate 08/27/22 1938 85     Resp 08/27/22 1938 18     Temp 08/27/22 1938 98.4 F (36.9 C)     Temp Source 08/27/22 1938 Oral     SpO2 08/27/22 1938 100 %     Weight 08/27/22 1939 210 lb (95.3 kg)     Height 08/27/22 1939 5\' 3"  (1.6 m)     Head Circumference --      Peak Flow --      Pain Score 08/27/22 1939 7     Pain Loc --      Pain Edu? --      Excl. in GC? --     Most recent vital signs: Vitals:   08/27/22 1938  BP: 123/79  Pulse: 85  Resp: 18  Temp: 98.4 F (36.9 C)  SpO2: 100%     General: Alert and in no acute distress. Eyes:  PERRL. EOMI. Head: No acute traumatic findings ENT:      Nose: No congestion/rhinnorhea.      Mouth/Throat: Mucous membranes are moist. Neck: No stridor. No cervical spine tenderness to palpation. Hematological/Lymphatic/Immunilogical: No cervical lymphadenopathy. Cardiovascular:  Good peripheral perfusion Respiratory: Normal respiratory effort without tachypnea or retractions. Lungs CTAB. Good air entry to the bases with no decreased or absent breath sounds. Gastrointestinal: Bowel sounds 4 quadrants. Soft and nontender to palpation.  No guarding or rigidity. No palpable masses. No distention. No CVA tenderness. Musculoskeletal: Full range of motion to all extremities. Patient has midline thoracic spine tenderness to palpation.  Neurologic:  No gross focal neurologic deficits are appreciated.  Skin:   No rash noted    ED Results / Procedures / Treatments   Labs (all labs ordered are listed, but only abnormal results are displayed) Labs Reviewed - No data to display      RADIOLOGY  I personally viewed and evaluated these images as part of my medical decision making, as well as reviewing the written report by the radiologist.  ED Provider Interpretation: X-rays of the thoracic spine unremarkable.  Chest x-ray reassuring.   PROCEDURES:  Critical Care performed: No  Procedures   MEDICATIONS ORDERED IN ED: Medications - No data to display   IMPRESSION / MDM / ASSESSMENT AND PLAN / ED COURSE  I reviewed the triage vital signs and the nursing notes.                              Assessment and plan: Back pain:  27 year old female presents to the emergency department with upper  back pain.  Chest x-ray and thoracic spine x-ray reassuring.  Will treat patient with meloxicam once daily for pain and inflammation as she is already taking a muscle relaxer.  All patient questions were answered.     FINAL CLINICAL IMPRESSION(S) / ED DIAGNOSES   Final diagnoses:  Acute thoracic back pain, unspecified back pain laterality     Rx / DC Orders   ED Discharge Orders          Ordered    meloxicam (MOBIC) 15 MG tablet  Daily        08/27/22 2236             Note:  This document was prepared using Dragon voice recognition software and may include unintentional dictation errors.   Pia Mau Cash, PA-C 08/27/22 2241    Willy Eddy, MD 08/28/22 [phone]

## 2022-08-27 NOTE — ED Notes (Signed)
Pt verbalizes understanding of discharge instructions. Opportunity for questioning and answers were provided. Pt discharged from ED to home by self.    

## 2022-08-27 NOTE — Discharge Instructions (Signed)
Take Meloxicam once daily for pain and inflammation.  

## 2022-11-05 ENCOUNTER — Ambulatory Visit
Admission: EM | Admit: 2022-11-05 | Discharge: 2022-11-05 | Disposition: A | Discharge status: Home / Self Care | Payer: Medicaid Other | Attending: Emergency Medicine | Admitting: Emergency Medicine

## 2022-11-05 ENCOUNTER — Other Ambulatory Visit: Payer: Self-pay

## 2022-11-05 DIAGNOSIS — H00011 Hordeolum externum right upper eyelid: Secondary | ICD-10-CM

## 2022-11-05 DIAGNOSIS — A084 Viral intestinal infection, unspecified: Secondary | ICD-10-CM | POA: Diagnosis not present

## 2022-11-05 MED ORDER — ERYTHROMYCIN 5 MG/GM OP OINT: TOPICAL_OINTMENT | OPHTHALMIC | 0 refills | Status: AC

## 2022-11-05 NOTE — Discharge Instructions (Addendum)
Presentation to the right eye is consistent with a stye, typically they fix her cells with time however yours has been present for a greater than expected timeframe  Place erythromycin ointment every morning and every evening along the lash lines similar to how you apply mascara in an effort to reduce any bacteria contributing to symptoms  Avoid eye attention rubbing as this will worsen symptoms and cause further irritation  You may use over-the-counter Pataday eyedrops if there is any concern regarding itching  Apply warm compresses to the affected area 10 to 15-minute intervals as much as possible to help reduce swelling, this may also help area to drain  If your symptoms continue to persist she will need to follow-up with eye doctor, information on lisinopril page as they are the only person who was able to drain a stye to the eye   Your symptoms are most likely caused by a virus, it will work its way out your system over the next few days  May Attempt any over-the-counter medicine to help settle the stomach  You can use Imodium which can be found over-the-counter and be mindful over use of this medication may cause opposite effect constipation  You can use over-the-counter ibuprofen or Tylenol, which ever you have at home, to help manage fevers  Continue to promote hydration throughout the day by using electrolyte replacement solution such as Gatorade, body armor, Pedialyte, which ever you have at home  Try eating bland foods such as bread, rice, toast, fruit which are easier on the stomach to digest, avoid foods that are overly spicy, overly seasoned or greasy

## 2022-11-05 NOTE — ED Provider Notes (Signed)
Renaldo Fiddler    CSN: [id number] Arrival date & time: 11/05/22  1410      History   Chief Complaint Chief Complaint  Patient presents with   Eye Pain    HPI Melissa Khan is a 27 y.o. female.   Patient presents for evaluation of right upper eyelid swelling for 4 to 5 weeks experiencing bump to the eyelid that began shortly after causing a sensation of foreign body and pain.  Denies use of contacts.  Has not attempted treatment.  Denies eye drainage, redness, injury or trauma.  Endorses subjective fever, nausea vomiting and diarrhea beginning 1 day ago.  Difficulty tolerating food and liquids.  Possible sick contact at work but unknown illness.  Has not attempted treatment of symptoms.  Denies abdominal pain, URI symptoms.   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   Cannabis use disorder, moderate, dependence (HCC) 09/08/2015   Suicidal ideation 09/08/2015   Self-inflicted laceration of wrist (HCC) 09/08/2015    Past Surgical History:  Procedure Laterality Date   CESAREAN SECTION N/A 10/21/2016   Procedure: CESAREAN SECTION;  Surgeon: Christeen Douglas, MD;  Location: [facility] ORS;  Service: Obstetrics;  Laterality: N/A;  Female born @ [birth year] Apgars: 9/9 Weight: 5lbs 12 ozs   EXTERNAL EAR SURGERY     WISDOM TOOTH EXTRACTION      OB History     Gravida  1   Para      Term      Preterm      AB      Living  0      SAB       IAB      Ectopic      Multiple      Live Births               Home Medications    Prior to Admission medications   Medication Sig Start Date End Date Taking? Authorizing Provider  erythromycin ophthalmic ointment Place a 1/2 inch ribbon of ointment into the lower eyelid. 11/05/22  Yes Leeta Grimme R, NP  methocarbamol (ROBAXIN) 500 MG tablet Take 1 tablet (500 mg total) by mouth 2 (two) times daily as needed for muscle spasms. 08/26/22  Yes Mickie Bail, NP  ferrous sulfate 325 (65 FE) MG tablet Take 1 tablet (325 mg total) by mouth daily with breakfast. Take with Vitamin C Patient not taking: Reported on 08/26/2022 10/24/16 12/23/16  Christeen Douglas, MD  HYDROcodone-acetaminophen (NORCO/VICODIN) 5-325 MG tablet Take 1 tablet by mouth every 6 (six) hours as needed for moderate pain. Patient not taking: Reported on 08/26/2022 03/28/18   Tommi Rumps, PA-C  naproxen (NAPROSYN) 500 MG tablet Take 1 tablet (500 mg total) by mouth 2 (two) times daily with a meal. Patient not taking: Reported on 08/26/2022 03/28/18   Tommi Rumps, PA-C  norgestimate-ethinyl estradiol (ORTHO-CYCLEN,SPRINTEC,PREVIFEM) 0.25-35 MG-MCG tablet Take 1 tablet by mouth daily. Patient not  taking: Reported on 08/26/2022 12/15/16   [provider]  sertraline (ZOLOFT) 50 MG tablet Take 50 mg by mouth daily. Patient not taking: Reported on 08/26/2022 12/15/16 12/15/17  [provider]    Family History Family History  Problem Relation Age of Onset   Breast cancer Paternal Aunt     Social History Social History   Tobacco Use   Smoking status: Former    Current packs/day: 0.00    Types: Cigarettes    Quit date: 07/24/2014    Years since quitting: 8.2   Smokeless tobacco: Never  Substance Use Topics   Alcohol use: No    Alcohol/week: 0.0 standard drinks of alcohol    Comment: ONCE A YEAR   Drug use: No    Comment: quit two weeks ago     Allergies   Propofol, Shrimp  [shellfish allergy], Tramadol, and Latex   Review of Systems Review of Systems   Physical Exam Triage Vital Signs ED Triage Vitals  Encounter Vitals Group     BP 11/05/22 1426 125/85     Systolic BP Percentile --      Diastolic BP Percentile --      Pulse Rate 11/05/22 1426 (!) 104     Resp 11/05/22 1426 18     Temp 11/05/22 1426 98.2 F (36.8 C)     Temp src --      SpO2 11/05/22 1426 100 %     Weight --      Height --      Head Circumference --      Peak Flow --      Pain Score 11/05/22 1423 6     Pain Loc --      Pain Education --      Exclude from Growth Chart --    No data found.  Updated Vital Signs BP 125/85   Pulse (!) 104   Temp 98.2 F (36.8 C)   Resp 18   LMP 11/05/2022   SpO2 100%   Visual Acuity Right Eye Distance:   Left Eye Distance:   Bilateral Distance:    Right Eye Near:   Left Eye Near:    Bilateral Near:     Physical Exam Constitutional:      Appearance: Normal appearance.  HENT:     Right Ear: Tympanic membrane, ear canal and external ear normal.     Left Ear: Tympanic membrane, ear canal and external ear normal.     Nose: Nose normal.     Mouth/Throat:     Mouth: Mucous membranes are moist.     Pharynx: Oropharynx is clear.  Eyes:     Extraocular Movements: Extraocular movements intact.     Comments: Stye present to the center of the right upper eyelid, erythematous and tender, nondraining, no abnormality to the sclera or iris or conjunctiva, vision grossly intact, EXTR ocular movements intact  Pulmonary:     Effort: Pulmonary effort is normal.  Abdominal:     General: Abdomen is flat. Bowel sounds are normal. There is no distension.     Palpations: Abdomen is soft.     Tenderness: There is no abdominal tenderness. There is no guarding.  Neurological:     Mental Status: She is alert and oriented to person, place, and time. Mental status is at baseline.      UC Treatments / Results  Labs (all labs ordered are listed, but  only abnormal results are displayed) Labs Reviewed - No data  to display  EKG   Radiology No results found.  Procedures Procedures (including critical care time)  Medications Ordered in UC Medications - No data to display  Initial Impression / Assessment and Plan / UC Course  I have reviewed the triage vital signs and the nursing notes.  Pertinent labs & imaging results that were available during my care of the patient were reviewed by me and considered in my medical decision making (see chart for details).  Hordeolum started him on the right upper eyelid, viral gastroenteritis  Presentation of eyes consistent with a stye, discussed with patient, has had prolonged timeframe, prescribed erythromycin ointment and recommended warm compresses to the affected area, no abnormality to vision, stable for outpatient management, given walking referral to ophthalmology for further evaluation and management if symptoms persist past use of medication  No abdominal tenderness noted on exam and vital signs are all stable most likely viral process, declined prescription for antiemetics and antidiarrheal medicines, advised increase fluid intake and bland diet until symptoms improve, may use over-the-counter medications as needed with urgent care follow-up if symptoms persist or worsen, work note given Final Clinical Impressions(s) / UC Diagnoses   Final diagnoses:  Hordeolum externum of right upper eyelid  Viral gastroenteritis     Discharge Instructions      Presentation to the right eye is consistent with a stye, typically they fix her cells with time however yours has been present for a greater than expected timeframe  Place erythromycin ointment every morning and every evening along the lash lines similar to how you apply mascara in an effort to reduce any bacteria contributing to symptoms  Avoid eye attention rubbing as this will worsen symptoms and cause further irritation  You may use  over-the-counter Pataday eyedrops if there is any concern regarding itching  Apply warm compresses to the affected area 10 to 15-minute intervals as much as possible to help reduce swelling, this may also help area to drain  If your symptoms continue to persist she will need to follow-up with eye doctor, information on lisinopril page as they are the only person who was able to drain a stye to the eye   Your symptoms are most likely caused by a virus, it will work its way out your system over the next few days  May Attempt any over-the-counter medicine to help settle the stomach  You can use Imodium which can be found over-the-counter and be mindful over use of this medication may cause opposite effect constipation  You can use over-the-counter ibuprofen or Tylenol, which ever you have at home, to help manage fevers  Continue to promote hydration throughout the day by using electrolyte replacement solution such as Gatorade, body armor, Pedialyte, which ever you have at home  Try eating bland foods such as bread, rice, toast, fruit which are easier on the stomach to digest, avoid foods that are overly spicy, overly seasoned or greasy    ED Prescriptions     Medication Sig Dispense Auth. Provider   erythromycin ophthalmic ointment Place a 1/2 inch ribbon of ointment into the lower eyelid. 3.5 g Valinda Hoar, NP      PDMP not reviewed this encounter.   Valinda Hoar, NP 11/05/22 [phone]

## 2022-11-05 NOTE — ED Triage Notes (Addendum)
Approximately 4 - 5 wks ago pt started having right eye swelling and now there is a bump on eyelid with pain and swelling and pt feels like something in her eye. She is also headache on both sides of head. Pt denies diff seeing

## 2024-03-03 ENCOUNTER — Emergency Department
Admission: EM | Admit: 2024-03-03 | Discharge: 2024-03-03 | Disposition: A | Discharge status: Home / Self Care | Attending: Emergency Medicine | Admitting: Emergency Medicine

## 2024-03-03 ENCOUNTER — Other Ambulatory Visit: Payer: Self-pay

## 2024-03-03 DIAGNOSIS — R112 Nausea with vomiting, unspecified: Secondary | ICD-10-CM | POA: Diagnosis present

## 2024-03-03 DIAGNOSIS — R110 Nausea: Secondary | ICD-10-CM

## 2024-03-03 LAB — CBC
HCT: 37.8 % (ref 36.0–46.0)
Hemoglobin: 11.1 g/dL — ABNORMAL LOW (ref 12.0–15.0)
MCH: 22.3 pg — ABNORMAL LOW (ref 26.0–34.0)
MCHC: 29.4 g/dL — ABNORMAL LOW (ref 30.0–36.0)
MCV: 75.9 fL — ABNORMAL LOW (ref 80.0–100.0)
Platelets: 284 K/uL (ref 150–400)
RBC: 4.98 MIL/uL (ref 3.87–5.11)
RDW: 16.6 % — ABNORMAL HIGH (ref 11.5–15.5)
WBC: 7 K/uL (ref 4.0–10.5)
nRBC: 0 % (ref 0.0–0.2)

## 2024-03-03 LAB — COMPREHENSIVE METABOLIC PANEL WITH GFR
ALT: 35 U/L (ref 0–44)
AST: 31 U/L (ref 15–41)
Albumin: 4.4 g/dL (ref 3.5–5.0)
Alkaline Phosphatase: 77 U/L (ref 38–126)
Anion gap: 13 (ref 5–15)
BUN: 9 mg/dL (ref 6–20)
CO2: 24 mmol/L (ref 22–32)
Calcium: 9 mg/dL (ref 8.9–10.3)
Chloride: 101 mmol/L (ref 98–111)
Creatinine, Ser: 0.8 mg/dL (ref 0.44–1.00)
GFR, Estimated: >60 mL/min
Glucose, Bld: 100 mg/dL — ABNORMAL HIGH (ref 70–99)
Potassium: 3.7 mmol/L (ref 3.5–5.1)
Sodium: 137 mmol/L (ref 135–145)
Total Bilirubin: 1.7 mg/dL — ABNORMAL HIGH (ref 0.0–1.2)
Total Protein: 7.4 g/dL (ref 6.5–8.1)

## 2024-03-03 LAB — URINALYSIS, ROUTINE W REFLEX MICROSCOPIC
Bilirubin Urine: NEGATIVE
Glucose, UA: NEGATIVE mg/dL
Hgb urine dipstick: NEGATIVE
Ketones, ur: 5 mg/dL — AB
Nitrite: NEGATIVE
Protein, ur: NEGATIVE mg/dL
Specific Gravity, Urine: 1.01 (ref 1.005–1.030)
pH: 7 (ref 5.0–8.0)

## 2024-03-03 LAB — LIPASE, BLOOD: Lipase: 33 U/L (ref 11–51)

## 2024-03-03 LAB — RESP PANEL BY RT-PCR (RSV, FLU A&B, COVID)  RVPGX2
Influenza A by PCR: NEGATIVE
Influenza B by PCR: NEGATIVE
Resp Syncytial Virus by PCR: NEGATIVE
SARS Coronavirus 2 by RT PCR: NEGATIVE

## 2024-03-03 LAB — POC URINE PREG, ED: Preg Test, Ur: NEGATIVE

## 2024-03-03 LAB — GROUP A STREP BY PCR: Group A Strep by PCR: NOT DETECTED

## 2024-03-03 MED ORDER — ONDANSETRON 4 MG PO TBDP: 4.0000 mg | ORAL_TABLET | Freq: Three times a day (TID) | ORAL | 0 refills | Status: AC | PRN

## 2024-03-03 NOTE — Discharge Instructions (Addendum)
 Please drink plenty of fluids.  Please take your nausea medicine as needed, as prescribed.  Please use Tylenol  or ibuprofen  every 6 hours if you develop a fever as written on the box.

## 2024-03-03 NOTE — ED Provider Notes (Addendum)
 "  [facility] Provider Note    Event Date/Time   First MD Initiated Contact with Patient 03/03/24 2108     (approximate)  History   Chief Complaint: Fever and Abdominal Pain  HPI  Melissa Khan is a 28 y.o. female history of anxiety, bipolar, presents to the emergency department for nausea and vomiting.  Patient states her son tested positive for influenza A several days ago.  She was at work tonight when she was feeling nauseated and vomited several times.  Patient denies any fever.  Denies any cough states she has felt somewhat congested.  Patient denies any abdominal pain, states she was having some upper abdominal discomfort but only when she was nauseated.  Physical Exam   Triage Vital Signs: ED Triage Vitals  Encounter Vitals Group     BP 03/03/24 1728 126/84     Girls Systolic BP Percentile --      Girls Diastolic BP Percentile --      Boys Systolic BP Percentile --      Boys Diastolic BP Percentile --      Pulse Rate 03/03/24 1723 89     Resp 03/03/24 1723 20     Temp 03/03/24 1723 97.9 F (36.6 C)     Temp Source 03/03/24 1723 Oral     SpO2 03/03/24 1723 100 %     Weight 03/03/24 1725 220 lb (99.8 kg)     Height 03/03/24 1725 5' 3 (1.6 m)     Head Circumference --      Peak Flow --      Pain Score 03/03/24 1724 5     Pain Loc --      Pain Education --      Exclude from Growth Chart --     Most recent vital signs: Vitals:   03/03/24 1728 03/03/24 2107  BP: 126/84 (!) 142/94  Pulse:  (!) 114  Resp:  19  Temp:  98.1 F (36.7 C)  SpO2:  100%    General: Awake, no distress.  CV:  Good peripheral perfusion.  Regular rate and rhythm  Resp:  Normal effort.  Equal breath sounds bilaterally.  Abd:  No distention.  Soft, nontender.  No rebound or guarding.  ED Results / Procedures / Treatments   MEDICATIONS ORDERED IN ED: Medications - No data to display   IMPRESSION / MDM / ASSESSMENT AND PLAN / ED COURSE  I reviewed the  triage vital signs and the nursing notes.  Patient's presentation is most consistent with acute presentation with potential threat to life or bodily function.  Patient presents to the emergency department for nausea vomiting.  Patient states she had to leave work and her boss made her come to the emergency department.  Patient states she was having some upper abdominal discomfort earlier when she was vomiting but none since.  No abdominal pain currently.  Benign abdominal exam.  Patient's workup is reassuring with a normal CBC normal chemistry and normal urinalysis.  Pregnancy test is negative strep test is negative.  Respiratory panel negative.  Patient states she is feeling much better.  We will discharge with Zofran  and supportive care.  Will have the patient follow-up with her doctor.  FINAL CLINICAL IMPRESSION(S) / ED DIAGNOSES   Nausea    Note:  This document was prepared using Dragon voice recognition software and may include unintentional dictation errors.   Dorothyann Drivers, MD 03/03/24 2209    Dorothyann Drivers, MD 03/03/24 2209  "

## 2024-03-03 NOTE — ED Triage Notes (Signed)
 Pt to ED for body aches and fever (101) since yesterday, son has the flu. States also generalized abdominal pain. States no fever today. States today at work she felt dizzy and has had a poor appetite all day. Respirations are unlabored.
